# Patient Record
Sex: Female | Born: 1940 | Race: White | Hispanic: No | Marital: Married | Smoking: Never smoker
Health system: Southern US, Community
[De-identification: ages and names within clinical notes are randomized; demographics above are authoritative.]

## PROBLEM LIST (undated history)

## (undated) DIAGNOSIS — Z86711 Personal history of pulmonary embolism: Secondary | ICD-10-CM

## (undated) DIAGNOSIS — C4490 Unspecified malignant neoplasm of skin, unspecified: Secondary | ICD-10-CM

## (undated) DIAGNOSIS — I82409 Acute embolism and thrombosis of unspecified deep veins of unspecified lower extremity: Secondary | ICD-10-CM

## (undated) DIAGNOSIS — E039 Hypothyroidism, unspecified: Secondary | ICD-10-CM

## (undated) DIAGNOSIS — K760 Fatty (change of) liver, not elsewhere classified: Secondary | ICD-10-CM

## (undated) DIAGNOSIS — E119 Type 2 diabetes mellitus without complications: Secondary | ICD-10-CM

## (undated) DIAGNOSIS — Z95818 Presence of other cardiac implants and grafts: Secondary | ICD-10-CM

## (undated) DIAGNOSIS — I5030 Unspecified diastolic (congestive) heart failure: Secondary | ICD-10-CM

## (undated) DIAGNOSIS — E669 Obesity, unspecified: Secondary | ICD-10-CM

## (undated) HISTORY — DX: Fatty (change of) liver, not elsewhere classified: K76.0

## (undated) HISTORY — DX: Unspecified malignant neoplasm of skin, unspecified: C44.90

## (undated) HISTORY — DX: Type 2 diabetes mellitus without complications: E11.9

## (undated) HISTORY — DX: Obesity, unspecified: E66.9

## (undated) HISTORY — PX: SKIN BIOPSY: SHX1

## (undated) HISTORY — PX: FINGER NAIL SURGERY: SHX717

## (undated) HISTORY — DX: Unspecified diastolic (congestive) heart failure: I50.30

## (undated) HISTORY — PX: REPLACEMENT TOTAL KNEE: SUR1224

## (undated) HISTORY — PX: CATARACT EXTRACTION: SUR2

## (undated) HISTORY — DX: Hypothyroidism, unspecified: E03.9

## (undated) HISTORY — DX: Personal history of pulmonary embolism: Z86.711

## (undated) HISTORY — PX: THYROIDECTOMY: SHX17

## (undated) HISTORY — DX: Presence of other cardiac implants and grafts: Z95.818

## (undated) HISTORY — PX: OTHER SURGICAL HISTORY: SHX169

## (undated) HISTORY — PX: HERNIA REPAIR: SHX51

## (undated) HISTORY — DX: Acute embolism and thrombosis of unspecified deep veins of unspecified lower extremity: I82.409

## (undated) HISTORY — PX: ABDOMINAL HYSTERECTOMY: SHX81

---

## 2008-11-03 ENCOUNTER — Encounter: Admission: RE | Admit: 2008-11-03 | Discharge: 2008-11-03 | Payer: Self-pay | Admitting: Internal Medicine

## 2019-02-14 LAB — TSH: TSH: 0.95 (ref 0.41–5.90)

## 2019-05-22 LAB — TSH: TSH: 1.17 (ref 0.41–5.90)

## 2019-07-13 ENCOUNTER — Encounter: Payer: Self-pay | Admitting: "Endocrinology

## 2019-07-13 ENCOUNTER — Ambulatory Visit (INDEPENDENT_AMBULATORY_CARE_PROVIDER_SITE_OTHER): Payer: Medicare Other | Admitting: "Endocrinology

## 2019-07-13 ENCOUNTER — Other Ambulatory Visit: Payer: Self-pay

## 2019-07-13 ENCOUNTER — Encounter (INDEPENDENT_AMBULATORY_CARE_PROVIDER_SITE_OTHER): Payer: Self-pay

## 2019-07-13 VITALS — BP 160/78 | HR 80 | Ht 63.0 in | Wt 281.0 lb

## 2019-07-13 DIAGNOSIS — E890 Postprocedural hypothyroidism: Secondary | ICD-10-CM

## 2019-07-13 DIAGNOSIS — Z85850 Personal history of malignant neoplasm of thyroid: Secondary | ICD-10-CM | POA: Diagnosis not present

## 2019-07-13 DIAGNOSIS — I10 Essential (primary) hypertension: Secondary | ICD-10-CM | POA: Insufficient documentation

## 2019-07-13 NOTE — Progress Notes (Signed)
Endocrinology Consult Note                                            07/13/2019, 6:47 PM   Subjective:    Patient ID: Bethany Riddle, female    DOB: January 13, 1941, PCP [name], MD   Past Medical History:  Diagnosis Date  . Diabetes mellitus, type II ([place])   . Hypothyroidism   . Skin cancer    Past Surgical History:  Procedure Laterality Date  . ABDOMINAL HYSTERECTOMY    . HERNIA REPAIR    . REPLACEMENT TOTAL KNEE    . THYROIDECTOMY     Social History   Socioeconomic History  . Marital status: Married    Spouse name: Not on file  . Number of children: Not on file  . Years of education: Not on file  . Highest education level: Not on file  Occupational History  . Not on file  Social Needs  . Financial resource strain: Not on file  . Food insecurity    Worry: Not on file    Inability: Not on file  . Transportation needs    Medical: Not on file    Non-medical: Not on file  Tobacco Use  . Smoking status: Never Smoker  . Smokeless tobacco: Never Used  Substance and Sexual Activity  . Alcohol use: Not Currently  . Drug use: Never  . Sexual activity: Not on file  Lifestyle  . Physical activity    Days per week: Not on file    Minutes per session: Not on file  . Stress: Not on file  Relationships  . Social Herbalist on phone: Not on file    Gets together: Not on file    Attends religious service: Not on file    Active member of club or organization: Not on file    Attends meetings of clubs or organizations: Not on file    Relationship status: Not on file  Other Topics Concern  . Not on file  Social History Narrative  . Not on file   Family History  Problem Relation Age of Onset  . Diabetes Mother   . Thyroid disease Mother   . Cancer Mother   . Cancer Sister   . Diabetes Sister   . Thyroid disease Sister    Outpatient Encounter Medications as of 07/13/2019  Medication Sig  . Ascorbic Acid (VITAMIN C) 1000 MG tablet Take 1,000  mg by mouth daily.  . B Complex-C (SUPER B COMPLEX PO) Take by mouth daily.  Marland Kitchen doxycycline (VIBRAMYCIN) 100 MG capsule daily.  . TURMERIC PO Take 3 tablets by mouth daily.  Marland Kitchen VITAMIN D PO Take 1,000 mg by mouth daily.  Marland Kitchen levothyroxine (SYNTHROID) 200 MCG tablet daily.   No facility-administered encounter medications on file as of 07/13/2019.    ALLERGIES: No Known Allergies  VACCINATION STATUS:  There is no immunization history on file for this patient.  HPI Bethany Riddle is 78 y.o. female who presents today with a medical history as above. she is being seen in consultation for postsurgical hypothyroidism requested by [name], MD.  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  The details of her treatment with surgery is not available for review.  She has been initiated on levothyroxine subsequent to her surgery.  She has taken various dose of levothyroxine over the years.  She is currently on levothyroxine 200 mcg p.o. daily before breakfast.  She is compliant to her medications, reports that she is taking it daily in the morning on empty stomach.  -She did not have recent surveillance imaging studies of her thyroid/neck.  She denies dysphagia, shortness of breath, nor voice change. -She reports progressive weight gain, has not really tried any dietary plans to lose weight. - she  admits there is a room for improvement in her diet and drink choices.  -She denies palpitations, tremors, nor heat/cold intolerance.  She denies any family history of thyroid malignancy.  Review of Systems  Constitutional: + recent weight gain, no fatigue, no subjective hyperthermia, no subjective hypothermia Eyes: no blurry vision, no xerophthalmia ENT: no sore throat, no nodules palpated in throat, no dysphagia/odynophagia, no hoarseness Cardiovascular: no Chest Pain, no Shortness of Breath, no palpitations, no leg swelling Respiratory: no cough, no shortness of  breath Gastrointestinal: no Nausea/Vomiting/Diarhhea Musculoskeletal: no muscle/joint aches Skin: no rashes Neurological: no tremors, no numbness, no tingling, no dizziness Psychiatric: no depression, no anxiety  Objective:    BP (!) 160/78   Pulse 80   Ht 5\' 3"  (1.6 m)   Wt 281 lb (127.5 kg)   BMI 49.78 kg/m   Wt Readings from Last 3 Encounters:  07/13/19 281 lb (127.5 kg)    Physical Exam  Constitutional: + BMI of 49, not in acute distress, normal state of mind Eyes: PERRLA, EOMI, no exophthalmos ENT: moist mucous membranes, + remote surgical scar on anterior lower neck,  no gross cervical lymphadenopathy Cardiovascular: normal precordial activity, Regular Rate and Rhythm, no Murmur/Rubs/Gallops Respiratory:  adequate breathing efforts, no gross chest deformity, Clear to auscultation bilaterally Gastrointestinal: abdomen soft, Non -tender, No distension, Bowel Sounds present, no gross organomegaly Musculoskeletal: no gross deformities, strength intact in all four extremities Skin: moist, warm, no rashes Neurological: no tremor with outstretched hands, Deep tendon reflexes normal in bilateral lower extremities.     Lab Results  Component Value Date   TSH 1.17 05/22/2019   TSH 0.95 02/14/2019       Assessment & Plan:   1. Postsurgical hypothyroidism 2. History of thyroid cancer 3.  Obesity - Bethany Riddle  is being seen at a kind request of [name], MD. - I have reviewed her available thyroid records and clinically evaluated the patient. - Based on these reviews, she has postsurgical hypothyroidism,  however, records of her initial treatment for thyroid cancer are not available to review.    -She will sign for medical release form for [place] to get his records from [facility]   -I will also proceed to obtain baseline thyroid/neck ultrasound in [facility]. -Her her most recent thyroid function tests, she will benefit from her current dose of  levothyroxine at 200 mcg.  She will have repeat thyroid function tests involving TSH, free T4/free T3, thyroglobulin levels and thyroglobulin advised before adjusting her dose.     - We discussed about the correct intake of her thyroid hormone, on empty stomach at fasting, with water, separated by at least 30 minutes from breakfast and other medications,  and separated by more than 4 hours from calcium, iron, multivitamins, acid reflux medications (PPIs). -Patient is made aware of the fact that thyroid hormone replacement is needed for life, dose to be adjusted by periodic monitoring of thyroid function tests.  Regarding her concern on obesity: -She has  room on lifestyle modification. - she  admits there is a room for improvement in her diet and drink choices. -  Suggestion is made for her to avoid simple carbohydrates  from her diet including Cakes, Sweet Desserts / Pastries, Ice Cream, Soda (diet and regular), Sweet Tea, Candies, Chips, Cookies, Sweet Pastries,  Store Bought Juices, Alcohol in Excess of  1-2 drinks a day, Artificial Sweeteners, Coffee Creamer, and "Sugar-free" Products. This will help patient to have stable blood glucose profile and potentially avoid unintended weight gain.  She has elevated blood pressure of 160/78, denies history of hypertension.  She wishes to minimize medications exposure. - I advised her  to maintain close follow up with [name], MD for primary care needs.   - Time spent with the patient: 45 minutes, of which >50% was spent in obtaining information about her symptoms, reviewing her previous labs/studies,  evaluations, and treatments, counseling her about her postsurgical hypothyroidism, history of thyroid malignancy, obesity, and developing a plan to confirm the diagnosis and long term treatment based on the latest standards of care/guidelines.    Thatiana Riddle participated in the discussions, expressed understanding, and voiced agreement with the  above plans.  All questions were answered to her satisfaction. she is encouraged to contact clinic should she have any questions or concerns prior to her return visit.  Follow up plan: Return in about 10 days (around 07/23/2019), or Request Thyroid records from [facility], for Labs Today- Non-Fasting Ok.   Glade Lloyd, MD [facility] [address] [place], [place] [address] Phone: [phone]  Fax: [phone]     07/13/2019, 6:47 PM  This note was partially dictated with voice recognition software. Similar sounding words can be transcribed inadequately or may not  be corrected upon review.

## 2019-07-14 LAB — THYROGLOBULIN ANTIBODY: Thyroglobulin Ab: <1 IU/mL (ref <=1)

## 2019-07-14 LAB — TSH: TSH: 2.78 mIU/L (ref 0.40–4.50)

## 2019-07-14 LAB — VITAMIN D 25 HYDROXY (VIT D DEFICIENCY, FRACTURES): Vit D, 25-Hydroxy: 24 ng/mL — ABNORMAL LOW (ref 30–100)

## 2019-07-14 LAB — T4, FREE: Free T4: 1.1 ng/dL (ref 0.8–1.8)

## 2019-07-14 LAB — T3, FREE: T3, Free: 2.1 pg/mL — ABNORMAL LOW (ref 2.3–4.2)

## 2019-07-14 LAB — THYROGLOBULIN LEVEL: Thyroglobulin: 0.1 ng/mL — ABNORMAL LOW

## 2019-07-27 ENCOUNTER — Ambulatory Visit: Payer: Medicare Other | Admitting: "Endocrinology

## 2019-08-02 ENCOUNTER — Ambulatory Visit (INDEPENDENT_AMBULATORY_CARE_PROVIDER_SITE_OTHER): Payer: Medicare Other | Admitting: "Endocrinology

## 2019-08-02 ENCOUNTER — Other Ambulatory Visit: Payer: Self-pay

## 2019-08-02 ENCOUNTER — Encounter: Payer: Self-pay | Admitting: "Endocrinology

## 2019-08-02 DIAGNOSIS — E559 Vitamin D deficiency, unspecified: Secondary | ICD-10-CM | POA: Diagnosis not present

## 2019-08-02 DIAGNOSIS — E890 Postprocedural hypothyroidism: Secondary | ICD-10-CM

## 2019-08-02 DIAGNOSIS — Z85850 Personal history of malignant neoplasm of thyroid: Secondary | ICD-10-CM

## 2019-08-02 MED ORDER — VITAMIN D3 125 MCG (5000 UT) PO CAPS: 5000.0000 [IU] | ORAL_CAPSULE | Freq: Every day | ORAL | 0 refills | Status: DC

## 2019-08-02 MED ORDER — LEVOTHYROXINE SODIUM 200 MCG PO TABS: 200.0000 ug | ORAL_TABLET | Freq: Every day | ORAL | 3 refills | Status: DC

## 2019-08-02 MED ORDER — LEVOTHYROXINE SODIUM 25 MCG PO TABS: 25.0000 ug | ORAL_TABLET | Freq: Every day | ORAL | 3 refills | Status: DC

## 2019-08-02 NOTE — Progress Notes (Signed)
08/02/2019, 1:05 PM                                Endocrinology Telehealth Visit Follow up Note -During COVID -19 Pandemic  I connected with Bethany Riddle on 08/02/2019   by telephone and verified that I am speaking with the correct person using two identifiers. Bethany Riddle, [birth year]. she has verbally consented to this visit. All issues noted in this document were discussed and addressed. The format was not optimal for physical exam.   Subjective:    Patient ID: Bethany Riddle, female    DOB: 08-17-1941, PCP [name], MD   Past Medical History:  Diagnosis Date  . Diabetes mellitus, type II ([place])   . Hypothyroidism   . Skin cancer    Past Surgical History:  Procedure Laterality Date  . ABDOMINAL HYSTERECTOMY    . HERNIA REPAIR    . REPLACEMENT TOTAL KNEE    . THYROIDECTOMY     Social History   Socioeconomic History  . Marital status: Married    Spouse name: Not on file  . Number of children: Not on file  . Years of education: Not on file  . Highest education level: Not on file  Occupational History  . Not on file  Social Needs  . Financial resource strain: Not on file  . Food insecurity    Worry: Not on file    Inability: Not on file  . Transportation needs    Medical: Not on file    Non-medical: Not on file  Tobacco Use  . Smoking status: Never Smoker  . Smokeless tobacco: Never Used  Substance and Sexual Activity  . Alcohol use: Not Currently  . Drug use: Never  . Sexual activity: Not on file  Lifestyle  . Physical activity    Days per week: Not on file    Minutes per session: Not on file  . Stress: Not on file  Relationships  . Social Herbalist on phone: Not on file    Gets together: Not on file    Attends religious service: Not on file    Active member of club or organization: Not on file    Attends meetings of clubs or organizations: Not on file    Relationship status: Not  on file  Other Topics Concern  . Not on file  Social History Narrative  . Not on file   Family History  Problem Relation Age of Onset  . Diabetes Mother   . Thyroid disease Mother   . Cancer Mother   . Cancer Sister   . Diabetes Sister   . Thyroid disease Sister    Outpatient Encounter Medications as of 08/02/2019  Medication Sig  . Ascorbic Acid (VITAMIN C) 1000 MG tablet Take 1,000 mg by mouth daily.  . B Complex-C (SUPER B COMPLEX PO) Take by mouth daily.  . Cholecalciferol (VITAMIN D3) 125 MCG (5000 UT) CAPS Take 1 capsule (5,000 Units total) by mouth daily.  Marland Kitchen doxycycline (VIBRAMYCIN) 100 MG capsule daily.  Marland Kitchen levothyroxine (SYNTHROID) 200 MCG tablet Take 1 tablet (200 mcg total) by mouth daily before breakfast.  .  levothyroxine (SYNTHROID) 25 MCG tablet Take 1 tablet (25 mcg total) by mouth daily before breakfast.  . TURMERIC PO Take 3 tablets by mouth daily.  Marland Kitchen VITAMIN D PO Take 1,000 mg by mouth daily.  . [DISCONTINUED] levothyroxine (SYNTHROID) 200 MCG tablet daily.   No facility-administered encounter medications on file as of 08/02/2019.    ALLERGIES: No Known Allergies  VACCINATION STATUS:  There is no immunization history on file for this patient.  HPI Bethany Riddle is 78 y.o. female who is engaged in telehealth via telephone for follow-up after she was seen in consultation for postsurgical hypothyroidism requested by [name], MD.  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  The details of her treatment with surgery is not available for review.  She has been initiated on levothyroxine subsequent to her surgery.  She has taken various dose of levothyroxine over the years.  She is currently on levothyroxine 200 mcg p.o. daily before breakfast.  She is compliant to her medications, reports that she is taking it daily in the morning on empty stomach.    She denies dysphagia, shortness of breath, nor voice change. -Her  previsit thyroid/neck ultrasound on July 27 2019 revealed evidence of prior thyroidectomy and otherwise negative ultrasound of the neck. -She reports progressive weight gain, has not really tried any dietary plans to lose weight. - she  admits there is a room for improvement in her diet and drink choices.  -She denies palpitations, tremors, nor heat/cold intolerance.  She denies any family history of thyroid malignancy.  Review of Systems  Constitutional: + recent weight gain, no fatigue, no subjective hyperthermia, no subjective hypothermia Limited as above.  Objective:    There were no vitals taken for this visit.  Wt Readings from Last 3 Encounters:  07/13/19 281 lb (127.5 kg)    Physical Exam      Lab Results  Component Value Date   TSH 2.78 07/13/2019   TSH 1.17 05/22/2019   TSH 0.95 02/14/2019   FREET4 1.1 07/13/2019       Assessment & Plan:   1. Postsurgical hypothyroidism 2. History of thyroid cancer 3.  Obesity  - I have reviewed her available thyroid records and clinically evaluated the patient. - Based on these reviews, she has postsurgical hypothyroidism,  however, records of her initial treatment for thyroid cancer are not available to review.  We are still waiting for her records from [facility]. -Her recent surveillance thyroid/neck ultrasound is negative for any residual thyroid tissue,mass,  or other sonographic abnormality.  -She will be considered for Thyrogen stimulated whole-body scan in 1 year.  -Based on her recent thyroid function tests, she will benefit from slight increase in her levothyroxine dose.  I discussed and increased her levothyroxine to 225 mcg p.o. daily before breakfast.  - We discussed about the correct intake of her thyroid hormone, on empty stomach at fasting, with water, separated by at least 30 minutes from breakfast and other medications,  and separated by more than 4 hours from calcium, iron, multivitamins, acid reflux  medications (PPIs). -Patient is made aware of the fact that thyroid hormone replacement is needed for life, dose to be adjusted by periodic monitoring of thyroid function tests.  Regarding her concern on obesity: -She has room on lifestyle modification. - she  admits there is a room for improvement in her diet and drink choices. -  Suggestion is made for her to avoid simple carbohydrates  from  her diet including Cakes, Sweet Desserts / Pastries, Ice Cream, Soda (diet and regular), Sweet Tea, Candies, Chips, Cookies, Sweet Pastries,  Store Bought Juices, Alcohol in Excess of  1-2 drinks a day, Artificial Sweeteners, Coffee Creamer, and "Sugar-free" Products. This will help patient to have stable blood glucose profile and potentially avoid unintended weight gain.   -During her last visit in this office, she has had  elevated blood pressure of 160/78, denies history of hypertension.  She wishes to minimize medications exposure. - I advised her  to maintain close follow up with [name], MD for primary care needs.   - Patient Care Time Today:  25 min, of which >50% was spent in  counseling and the rest reviewing her  current and  previous labs/studies, previous treatments, and medications' doses and developing a plan for long-term care based on the latest recommendations for standards of care.   Bindu Riddle participated in the discussions, expressed understanding, and voiced agreement with the above plans.  All questions were answered to her satisfaction. she is encouraged to contact clinic should she have any questions or concerns prior to her return visit.   Follow up plan: Return in about 3 months (around 11/02/2019) for Follow up with Pre-visit Labs.   Glade Lloyd, MD [facility] [address] [place], [place] [address] Phone: [phone]  Fax: [phone]     08/02/2019, 1:05 PM  This note was partially dictated with voice  recognition software. Similar sounding words can be transcribed inadequately or may not  be corrected upon review.

## 2019-10-31 LAB — TSH: TSH: 1.35 mIU/L (ref 0.40–4.50)

## 2019-10-31 LAB — T4, FREE: Free T4: 1.1 ng/dL (ref 0.8–1.8)

## 2019-10-31 LAB — VITAMIN D 25 HYDROXY (VIT D DEFICIENCY, FRACTURES): Vit D, 25-Hydroxy: 26 ng/mL — ABNORMAL LOW (ref 30–100)

## 2019-11-07 ENCOUNTER — Ambulatory Visit (INDEPENDENT_AMBULATORY_CARE_PROVIDER_SITE_OTHER): Payer: Medicare Other | Admitting: "Endocrinology

## 2019-11-07 ENCOUNTER — Other Ambulatory Visit: Payer: Self-pay

## 2019-11-07 ENCOUNTER — Encounter: Payer: Self-pay | Admitting: "Endocrinology

## 2019-11-07 DIAGNOSIS — E890 Postprocedural hypothyroidism: Secondary | ICD-10-CM

## 2019-11-07 DIAGNOSIS — Z85850 Personal history of malignant neoplasm of thyroid: Secondary | ICD-10-CM

## 2019-11-07 DIAGNOSIS — E559 Vitamin D deficiency, unspecified: Secondary | ICD-10-CM | POA: Diagnosis not present

## 2019-11-07 MED ORDER — SYNTHROID 200 MCG PO TABS: 200.0000 ug | ORAL_TABLET | Freq: Every day | ORAL | 6 refills | Status: DC

## 2019-11-07 MED ORDER — SYNTHROID 25 MCG PO TABS: 25.0000 ug | ORAL_TABLET | Freq: Every day | ORAL | 6 refills | Status: DC

## 2019-11-07 MED ORDER — VITAMIN D3 125 MCG (5000 UT) PO CAPS: 5000.0000 [IU] | ORAL_CAPSULE | Freq: Every day | ORAL | 1 refills | Status: AC

## 2019-11-07 NOTE — Progress Notes (Signed)
11/07/2019, 10:49 AM                                Endocrinology Telehealth Visit Follow up Note -During COVID -19 Pandemic  I connected with Bethany Riddle on 11/07/2019   by telephone and verified that I am speaking with the correct person using two identifiers. Bethany Riddle, Aug 12, 1941. she has verbally consented to this visit. All issues noted in this document were discussed and addressed. The format was not optimal for physical exam.   Subjective:    Patient ID: Bethany Riddle, female    DOB: 13-Dec-1941, PCP [name], MD   Past Medical History:  Diagnosis Date  . Diabetes mellitus, type II ([place])   . Hypothyroidism   . Skin cancer    Past Surgical History:  Procedure Laterality Date  . ABDOMINAL HYSTERECTOMY    . HERNIA REPAIR    . REPLACEMENT TOTAL KNEE    . THYROIDECTOMY     Social History   Socioeconomic History  . Marital status: Married    Spouse name: Not on file  . Number of children: Not on file  . Years of education: Not on file  . Highest education level: Not on file  Occupational History  . Not on file  Social Needs  . Financial resource strain: Not on file  . Food insecurity    Worry: Not on file    Inability: Not on file  . Transportation needs    Medical: Not on file    Non-medical: Not on file  Tobacco Use  . Smoking status: Never Smoker  . Smokeless tobacco: Never Used  Substance and Sexual Activity  . Alcohol use: Not Currently  . Drug use: Never  . Sexual activity: Not on file  Lifestyle  . Physical activity    Days per week: Not on file    Minutes per session: Not on file  . Stress: Not on file  Relationships  . Social Herbalist on phone: Not on file    Gets together: Not on file    Attends religious service: Not on file    Active member of club or organization: Not on file    Attends meetings of clubs or organizations: Not on file    Relationship status:  Not on file  Other Topics Concern  . Not on file  Social History Narrative  . Not on file   Family History  Problem Relation Age of Onset  . Diabetes Mother   . Thyroid disease Mother   . Cancer Mother   . Cancer Sister   . Diabetes Sister   . Thyroid disease Sister    Outpatient Encounter Medications as of 11/07/2019  Medication Sig  . Ascorbic Acid (VITAMIN C) 1000 MG tablet Take 1,000 mg by mouth daily.  . B Complex-C (SUPER B COMPLEX PO) Take by mouth daily.  . Cholecalciferol (VITAMIN D3) 125 MCG (5000 UT) CAPS Take 1 capsule (5,000 Units total) by mouth daily.  Marland Kitchen doxycycline (VIBRAMYCIN) 100 MG capsule daily.  Marland Kitchen SYNTHROID 200 MCG tablet Take 1 tablet (200 mcg total) by mouth daily before breakfast.  .  SYNTHROID 25 MCG tablet Take 1 tablet (25 mcg total) by mouth daily before breakfast.  . TURMERIC PO Take 3 tablets by mouth daily.  Marland Kitchen VITAMIN D PO Take 1,000 mg by mouth daily.  . [DISCONTINUED] Cholecalciferol (VITAMIN D3) 125 MCG (5000 UT) CAPS Take 1 capsule (5,000 Units total) by mouth daily.  . [DISCONTINUED] levothyroxine (SYNTHROID) 200 MCG tablet Take 1 tablet (200 mcg total) by mouth daily before breakfast.  . [DISCONTINUED] levothyroxine (SYNTHROID) 25 MCG tablet Take 1 tablet (25 mcg total) by mouth daily before breakfast.   No facility-administered encounter medications on file as of 11/07/2019.    ALLERGIES: No Known Allergies  VACCINATION STATUS:  There is no immunization history on file for this patient.  HPI Bethany Riddle is 78 y.o. female who is engaged in telehealth via telephone for follow-up after she was seen in consultation for postsurgical hypothyroidism requested by [name], MD.  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  The details of her treatment with surgery is not available for review.  She has been initiated on levothyroxine subsequent to her surgery.  She has taken various dose of levothyroxine  over the years.  She is currently on levothyroxine 225 mcg p.o. daily before breakfast.  She is compliant to her medications, reports that she is taking it daily in the morning on empty stomach. -She still complains of fatigue, and hair loss.  She wishes to be switched to brand name Synthroid from levothyroxine.   She denies dysphagia, shortness of breath, nor voice change. -Her previsit thyroid/neck ultrasound on July 27 2019 revealed evidence of prior thyroidectomy and otherwise negative ultrasound of the neck. -She reports progressive weight gain, has not really tried any dietary plans to lose weight. - she  admits there is a room for improvement in her diet and drink choices.  -She denies palpitations, tremors, nor heat/cold intolerance.  She denies any family history of thyroid malignancy.  Review of Systems  limited as above.  Objective:    There were no vitals taken for this visit.  Wt Readings from Last 3 Encounters:  07/13/19 281 lb (127.5 kg)    Physical Exam    Lab Results  Component Value Date   TSH 1.35 10/31/2019   TSH 2.78 07/13/2019   TSH 1.17 05/22/2019   TSH 0.95 02/14/2019   FREET4 1.1 10/31/2019   FREET4 1.1 07/13/2019       Assessment & Plan:   1. Postsurgical hypothyroidism 2. History of thyroid cancer 3.  Vitamin D deficiency  -Her previsit thyroid function tests are consistent with appropriate replacement.  She wishes to be switched to Synthroid.  I discussed and switched to her levothyroxine to Synthroid 225 mcg p.o. daily before breakfast.   - We discussed about the correct intake of her thyroid hormone, on empty stomach at fasting, with water, separated by at least 30 minutes from breakfast and other medications,  and separated by more than 4 hours from calcium, iron, multivitamins, acid reflux medications (PPIs). -Patient is made aware of the fact that thyroid hormone replacement is needed for life, dose to be adjusted by periodic monitoring of  thyroid function tests.  -She has history of thyroid malignancy status post surgery in 2012.  Records of her initial treatment for thyroid cancer are not available to review.  We are still waiting for her records from [facility]. -Her recent surveillance thyroid/neck ultrasound in August 2020 is negative for any residual thyroid tissue,mass,  or other sonographic abnormality.  -She will be considered for Thyrogen stimulated whole-body scan in 1 year. -She is advised to continue vitamin D3 supplement at 5000 units daily until next measurement.  - I advised her  to maintain close follow up with [name], MD for primary care needs.  Time for this visit: 15 minutes. Bethany Riddle  participated in the discussions, expressed understanding, and voiced agreement with the above plans.  All questions were answered to her satisfaction. she is encouraged to contact clinic should she have any questions or concerns prior to her return visit.  Follow up plan: Return in about 6 months (around 05/06/2020) for Follow up with Pre-visit Labs.   Glade Lloyd, MD [facility] [address] [place], [place] [address] Phone: [phone]  Fax: [phone]     11/07/2019, 10:49 AM  This note was partially dictated with voice recognition software. Similar sounding words can be transcribed inadequately or may not  be corrected upon review.

## 2019-12-01 ENCOUNTER — Telehealth: Payer: Self-pay | Admitting: "Endocrinology

## 2019-12-01 NOTE — Telephone Encounter (Signed)
Pt said that she thinks taking 225MCG is too much of her thyroid medication. She is going to go ahead and cut out the [facility] for a couple of days to see how she feels. She is keeping a headache and feels her BP is low. She will see how she feels Monday and let us know.

## 2019-12-01 NOTE — Telephone Encounter (Signed)
Sounds good

## 2020-01-29 ENCOUNTER — Other Ambulatory Visit: Payer: Self-pay | Admitting: "Endocrinology

## 2020-01-29 ENCOUNTER — Telehealth: Payer: Self-pay | Admitting: "Endocrinology

## 2020-01-29 DIAGNOSIS — E890 Postprocedural hypothyroidism: Secondary | ICD-10-CM

## 2020-01-29 NOTE — Telephone Encounter (Signed)
Noted, mailed lab order to pt per her request

## 2020-01-29 NOTE — Telephone Encounter (Signed)
I want her to do her labs soon as possible. TSH/ Free T4, and we will decide on her dose.

## 2020-01-29 NOTE — Telephone Encounter (Signed)
Pt said she thinks she needs her thyroid medication lowered. She said she has been having heart issues and is seeing another dr for this. Please Advise.

## 2020-01-31 NOTE — Telephone Encounter (Signed)
Pt seen her PMD yesterday and she had her labs done for her thyroid. He said that they looked okay. She was taking 225 and she quit taking it a month ago because she could tell it was too much. She said her hearts stop beating to fast. That is why she went to the Cardiologist. She will be having a stress test on 2/18. She did a TSH/Free T4 on 01/01/20. Will have pt fax those results to [place].

## 2020-02-02 LAB — TSH: TSH: 3.7 mIU/L (ref 0.40–4.50)

## 2020-02-02 LAB — T4, FREE: Free T4: 1.2 ng/dL (ref 0.8–1.8)

## 2020-02-02 LAB — VITAMIN D 25 HYDROXY (VIT D DEFICIENCY, FRACTURES): Vit D, 25-Hydroxy: 42 ng/mL (ref 30–100)

## 2020-02-06 ENCOUNTER — Ambulatory Visit (INDEPENDENT_AMBULATORY_CARE_PROVIDER_SITE_OTHER): Payer: Medicare Other | Admitting: "Endocrinology

## 2020-02-06 ENCOUNTER — Encounter: Payer: Self-pay | Admitting: "Endocrinology

## 2020-02-06 DIAGNOSIS — E559 Vitamin D deficiency, unspecified: Secondary | ICD-10-CM

## 2020-02-06 DIAGNOSIS — Z85850 Personal history of malignant neoplasm of thyroid: Secondary | ICD-10-CM

## 2020-02-06 DIAGNOSIS — E890 Postprocedural hypothyroidism: Secondary | ICD-10-CM | POA: Diagnosis not present

## 2020-02-06 MED ORDER — SYNTHROID 200 MCG PO TABS: 200.0000 ug | ORAL_TABLET | Freq: Every day | ORAL | 1 refills | Status: DC

## 2020-02-06 NOTE — Progress Notes (Signed)
02/06/2020, 4:02 PM                                Endocrinology Telehealth Visit Follow up Note -During COVID -19 Pandemic  I connected with Jerrika Riddle on 02/06/2020   by telephone and verified that I am speaking with the correct person using two identifiers. Bethany Riddle, [birth year]. she has verbally consented to this visit. All issues noted in this document were discussed and addressed. The format was not optimal for physical exam.   Subjective:    Patient ID: Bethany Riddle, female    DOB: 01-11-1941, PCP [name], MD   Past Medical History:  Diagnosis Date  . Diabetes mellitus, type II ([place])   . Hypothyroidism   . Skin cancer    Past Surgical History:  Procedure Laterality Date  . ABDOMINAL HYSTERECTOMY    . HERNIA REPAIR    . REPLACEMENT TOTAL KNEE    . THYROIDECTOMY     Social History   Socioeconomic History  . Marital status: Married    Spouse name: Not on file  . Number of children: Not on file  . Years of education: Not on file  . Highest education level: Not on file  Occupational History  . Not on file  Tobacco Use  . Smoking status: Never Smoker  . Smokeless tobacco: Never Used  Substance and Sexual Activity  . Alcohol use: Not Currently  . Drug use: Never  . Sexual activity: Not on file  Other Topics Concern  . Not on file  Social History Narrative  . Not on file   Social Determinants of Health   Financial Resource Strain:   . Difficulty of Paying Living Expenses: Not on file  Food Insecurity:   . Worried About Charity fundraiser in the Last Year: Not on file  . Ran Out of Food in the Last Year: Not on file  Transportation Needs:   . Lack of Transportation (Medical): Not on file  . Lack of Transportation (Non-Medical): Not on file  Physical Activity:   . Days of Exercise per Week: Not on file  . Minutes of Exercise per Session: Not on file  Stress:   . Feeling of Stress : Not  on file  Social Connections:   . Frequency of Communication with Friends and Family: Not on file  . Frequency of Social Gatherings with Friends and Family: Not on file  . Attends Religious Services: Not on file  . Active Member of Clubs or Organizations: Not on file  . Attends Archivist Meetings: Not on file  . Marital Status: Not on file   Family History  Problem Relation Age of Onset  . Diabetes Mother   . Thyroid disease Mother   . Cancer Mother   . Cancer Sister   . Diabetes Sister   . Thyroid disease Sister    Outpatient Encounter Medications as of 02/06/2020  Medication Sig  . Ascorbic Acid (VITAMIN C) 1000 MG tablet Take 1,000 mg by mouth daily.  . B Complex-C (SUPER B COMPLEX PO) Take by mouth daily.  . Cholecalciferol (VITAMIN D3) 125 MCG (5000 UT) CAPS  Take 1 capsule (5,000 Units total) by mouth daily.  Marland Kitchen doxycycline (VIBRAMYCIN) 100 MG capsule daily.  Marland Kitchen SYNTHROID 200 MCG tablet Take 1 tablet (200 mcg total) by mouth daily before breakfast.  . TURMERIC PO Take 3 tablets by mouth daily.  Marland Kitchen VITAMIN D PO Take 1,000 mg by mouth daily.  . [DISCONTINUED] SYNTHROID 200 MCG tablet Take 1 tablet (200 mcg total) by mouth daily before breakfast.  . [DISCONTINUED] SYNTHROID 25 MCG tablet Take 1 tablet (25 mcg total) by mouth daily before breakfast.   No facility-administered encounter medications on file as of 02/06/2020.   ALLERGIES: No Known Allergies  VACCINATION STATUS:  There is no immunization history on file for this patient.  HPI Bethany Riddle is 79 y.o. female who is engaged in telehealth via telephone for follow-up after she was seen in consultation for postsurgical hypothyroidism requested by [name], MD.  See previous visit notes.  Recently due to palpitations she decided to lower her dose of Synthroid to 200 mcg p.o. daily.  Her palpitation continues, and she is working with the cardiologist who  scheduled for stress test.  She noticed weight  gain, feeling fatigued.   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  The details of her treatment with surgery is not available for review.  She was continued on 225 mcg of Synthroid during her last visit.      She denies dysphagia, shortness of breath, nor voice change. -Her prior of  thyroid/neck ultrasound on July 27 2019 revealed evidence of prior thyroidectomy and otherwise negative ultrasound of the neck.  She denies any family history of thyroid malignancy.  Review of Systems  Limited as above.  Objective:    There were no vitals taken for this visit.  Wt Readings from Last 3 Encounters:  07/13/19 281 lb (127.5 kg)    Physical Exam    Lab Results  Component Value Date   TSH 3.70 02/01/2020   TSH 1.35 10/31/2019   TSH 2.78 07/13/2019   TSH 1.17 05/22/2019   TSH 0.95 02/14/2019   FREET4 1.2 02/01/2020   FREET4 1.1 10/31/2019   FREET4 1.1 07/13/2019       Assessment & Plan:   1. Postsurgical hypothyroidism 2. History of thyroid cancer 3.  Vitamin D deficiency  -Her previsit thyroid function tests are such that she may need Synthroid 225 mcg p.o. daily.  However, due to her recent palpitations she is advised to maintain her current dose of Synthroid at 200 mcg p.o. daily before breakfast.  Her dose will be readjusted after her cardiology work-up is complete.  - We discussed about the correct intake of her thyroid hormone, on empty stomach at fasting, with water, separated by at least 30 minutes from breakfast and other medications,  and separated by more than 4 hours from calcium, iron, multivitamins, acid reflux medications (PPIs). -Patient is made aware of the fact that thyroid hormone replacement is needed for life, dose to be adjusted by periodic monitoring of thyroid function tests.  -She has history of thyroid malignancy status post surgery in 2012.  Records of her initial treatment for thyroid cancer are not available  to review.  We are still waiting for her records from [facility]. -Her recent surveillance thyroid/neck ultrasound in August 2020 is negative for any residual thyroid tissue,mass,  or other sonographic abnormality.  -She will be considered for Thyrogen stimulated whole-body scan in 1 year. -She is advised to continue vitamin  D3 at 5000 units daily throughout the winter.    - I advised her  to maintain close follow up with [name], MD for primary care needs.     - Time spent on this patient care encounter:  25 minutes of which 50% was spent in  counseling and the rest reviewing  her current and  previous labs / studies and medications  doses and developing a plan for long term care. Ellyce Riddle  participated in the discussions, expressed understanding, and voiced agreement with the above plans.  All questions were answered to her satisfaction. she is encouraged to contact clinic should she have any questions or concerns prior to her return visit.   Follow up plan: Return in about 3 months (around 05/05/2020) for Follow up with Pre-visit Labs.   Glade Lloyd, MD [facility] [address] [place], [place] [address] Phone: [phone]  Fax: [phone]     02/06/2020, 4:02 PM  This note was partially dictated with voice recognition software. Similar sounding words can be transcribed inadequately or may not  be corrected upon review.

## 2020-04-29 LAB — TSH: TSH: 2.88 mIU/L (ref 0.40–4.50)

## 2020-04-29 LAB — T4, FREE: Free T4: 1.5 ng/dL (ref 0.8–1.8)

## 2020-05-07 ENCOUNTER — Ambulatory Visit (INDEPENDENT_AMBULATORY_CARE_PROVIDER_SITE_OTHER): Payer: Medicare Other | Admitting: "Endocrinology

## 2020-05-07 ENCOUNTER — Encounter: Payer: Self-pay | Admitting: "Endocrinology

## 2020-05-07 ENCOUNTER — Other Ambulatory Visit: Payer: Self-pay

## 2020-05-07 VITALS — BP 158/72 | HR 74 | Ht 63.0 in | Wt 277.2 lb

## 2020-05-07 DIAGNOSIS — Z85850 Personal history of malignant neoplasm of thyroid: Secondary | ICD-10-CM

## 2020-05-07 DIAGNOSIS — E890 Postprocedural hypothyroidism: Secondary | ICD-10-CM

## 2020-05-07 DIAGNOSIS — E559 Vitamin D deficiency, unspecified: Secondary | ICD-10-CM

## 2020-05-07 MED ORDER — SYNTHROID 200 MCG PO TABS: 200.0000 ug | ORAL_TABLET | Freq: Every day | ORAL | 3 refills | Status: DC

## 2020-05-07 NOTE — Progress Notes (Signed)
05/07/2020, 12:54 PM                     Endocrinology follow-up note   Subjective:    Patient ID: Bethany Riddle, female    DOB: [birth year], PCP [name], MD   Past Medical History:  Diagnosis Date  . Diabetes mellitus, type II ([place])   . Hypothyroidism   . Skin cancer    Past Surgical History:  Procedure Laterality Date  . ABDOMINAL HYSTERECTOMY    . HERNIA REPAIR    . REPLACEMENT TOTAL KNEE    . THYROIDECTOMY     Social History   Socioeconomic History  . Marital status: Married    Spouse name: Not on file  . Number of children: Not on file  . Years of education: Not on file  . Highest education level: Not on file  Occupational History  . Not on file  Tobacco Use  . Smoking status: Never Smoker  . Smokeless tobacco: Never Used  Substance and Sexual Activity  . Alcohol use: Not Currently  . Drug use: Never  . Sexual activity: Not on file  Other Topics Concern  . Not on file  Social History Narrative  . Not on file   Social Determinants of Health   Financial Resource Strain:   . Difficulty of Paying Living Expenses:   Food Insecurity:   . Worried About Charity fundraiser in the Last Year:   . Arboriculturist in the Last Year:   Transportation Needs:   . Film/video editor (Medical):   Marland Kitchen Lack of Transportation (Non-Medical):   Physical Activity:   . Days of Exercise per Week:   . Minutes of Exercise per Session:   Stress:   . Feeling of Stress :   Social Connections:   . Frequency of Communication with Friends and Family:   . Frequency of Social Gatherings with Friends and Family:   . Attends Religious Services:   . Active Member of Clubs or Organizations:   . Attends Archivist Meetings:   Marland Kitchen Marital Status:    Family History  Problem Relation Age of Onset  . Diabetes Mother   . Thyroid disease Mother   . Cancer Mother   . Cancer Sister   . Diabetes Sister   . Thyroid  disease Sister    Outpatient Encounter Medications as of 05/07/2020  Medication Sig  . Ascorbic Acid (VITAMIN C) 1000 MG tablet Take 1,000 mg by mouth daily.  . B Complex-C (SUPER B COMPLEX PO) Take by mouth daily.  . Cholecalciferol (VITAMIN D3) 125 MCG (5000 UT) CAPS Take 1 capsule (5,000 Units total) by mouth daily.  Marland Kitchen diltiazem (CARDIZEM CD) 120 MG 24 hr capsule Take 1 capsule by mouth daily.  Marland Kitchen doxycycline (VIBRAMYCIN) 100 MG capsule daily.  Marland Kitchen SYNTHROID 200 MCG tablet Take 1 tablet (200 mcg total) by mouth daily before breakfast.  . TURMERIC PO Take 3 tablets by mouth daily.  Marland Kitchen VITAMIN D PO Take 1,000 mg by mouth daily.  . [DISCONTINUED] SYNTHROID 200 MCG tablet Take 1 tablet (200 mcg total) by mouth daily before breakfast.   No facility-administered encounter medications on file as of  05/07/2020.   ALLERGIES: No Known Allergies  VACCINATION STATUS:  There is no immunization history on file for this patient.  HPI Bethany Riddle is 79 y.o. female who is being seen in follow-up after she was seen in consultation for postsurgical hypothyroidism.    PMD: [name], MD.  See previous visit notes.  Recently due to palpitations her Synthroid was lowered to 200 mcg p.o. daily.  She denies palpitations, tremors, nor heat/cold intolerance.  She was initiated on diltiazem by her cardiologist for tachyarrhythmia.   -She has lost 4 pounds since last visit. -She did not have any new complaints today.   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  The details of her treatment with surgery is not available for review.     She denies dysphagia, shortness of breath, nor voice change. -Her prior of  thyroid/neck ultrasound on July 27 2019 revealed evidence of prior thyroidectomy and otherwise negative ultrasound of the neck.  She denies any family history of thyroid malignancy.  Review of Systems  Limited as above.  Objective:    BP (!) 158/72    Pulse 74   Ht 5\' 3"  (1.6 m)   Wt 277 lb 3.2 oz (125.7 kg)   BMI 49.10 kg/m   Wt Readings from Last 3 Encounters:  05/07/20 277 lb 3.2 oz (125.7 kg)  07/13/19 281 lb (127.5 kg)    Physical Exam   Physical Exam- Limited  Constitutional:  Body mass index is 49.1 kg/m. , not in acute distress, normal state of mind Eyes:  EOMI, no exophthalmos Neck: Supple Thyroid: + Old scar from prior thyroidectomy, no mass lesions Respiratory: Adequate breathing efforts Musculoskeletal: no gross deformities, strength intact in all four extremities, no gross restriction of joint movements Skin:  no rashes, no hyperemia Neurological: no tremor with outstretched hands,    Lab Results  Component Value Date   TSH 2.88 04/29/2020   TSH 3.70 02/01/2020   TSH 1.35 10/31/2019   TSH 2.78 07/13/2019   TSH 1.17 05/22/2019   TSH 0.95 02/14/2019   FREET4 1.5 04/29/2020   FREET4 1.2 02/01/2020   FREET4 1.1 10/31/2019   FREET4 1.1 07/13/2019       Assessment & Plan:   1. Postsurgical hypothyroidism 2. History of thyroid cancer 3.  Vitamin D deficiency  -Her previsit thyroid function tests are consistent with appropriate replacement.  She is advised to continue Synthroid 200 mcg p.o. daily before breakfast.    - We discussed about the correct intake of her thyroid hormone, on empty stomach at fasting, with water, separated by at least 30 minutes from breakfast and other medications,  and separated by more than 4 hours from calcium, iron, multivitamins, acid reflux medications (PPIs). -Patient is made aware of the fact that thyroid hormone replacement is needed for life, dose to be adjusted by periodic monitoring of thyroid function tests.   -She has history of thyroid malignancy status post surgery in 2012.  Records of her initial treatment for thyroid cancer are not available to review.   -Her recent surveillance thyroid/neck ultrasound in August 2020 is negative for any residual thyroid  tissue,mass,  or other sonographic abnormality.  -She will be considered for repeat thyroid/neck ultrasound before her next visit in 1 year.  -She is advised to continue vitamin D3 5000 units daily.     - I advised her  to maintain close follow up with [name], MD for primary care needs.     -  Time spent on this patient care encounter:  25 minutes of which 50% was spent in  counseling and the rest reviewing  her current and  previous labs / studies and medications  doses and developing a plan for long term care. Bethany Riddle  participated in the discussions, expressed understanding, and voiced agreement with the above plans.  All questions were answered to her satisfaction. she is encouraged to contact clinic should she have any questions or concerns prior to her return visit.   Follow up plan: Return in about 1 year (around 05/07/2021) for F/U with Pre-visit Labs, Thyroid / Neck Ultrasound.   Glade Lloyd, MD [facility] [address] [place], [place] [address] Phone: [phone]  Fax: [phone]     05/07/2020, 12:54 PM  This note was partially dictated with voice recognition software. Similar sounding words can be transcribed inadequately or may not  be corrected upon review.

## 2020-08-14 LAB — TSH: TSH: 7.19 — AB (ref 0.41–5.90)

## 2020-08-20 ENCOUNTER — Other Ambulatory Visit: Payer: Self-pay | Admitting: "Endocrinology

## 2020-08-21 ENCOUNTER — Telehealth (INDEPENDENT_AMBULATORY_CARE_PROVIDER_SITE_OTHER): Payer: Medicare Other | Admitting: "Endocrinology

## 2020-08-21 ENCOUNTER — Encounter: Payer: Self-pay | Admitting: "Endocrinology

## 2020-08-21 ENCOUNTER — Other Ambulatory Visit: Payer: Self-pay

## 2020-08-21 VITALS — BP 125/60 | Ht 64.0 in | Wt 269.0 lb

## 2020-08-21 DIAGNOSIS — E890 Postprocedural hypothyroidism: Secondary | ICD-10-CM | POA: Diagnosis not present

## 2020-08-21 NOTE — Progress Notes (Signed)
08/21/2020, 6:48 PM                                    Endocrinology Telehealth Visit Follow up Note -During COVID -19 Pandemic  This visit type was conducted  via telephone due to national recommendations for restrictions regarding the COVID-19 Pandemic  in an effort to limit this patient's exposure and mitigate transmission of the corona virus.   I connected with Bethany Riddle on 08/21/2020   by telephone and verified that I am speaking with the correct person using two identifiers. Bethany Riddle, September 18, 1941. she has verbally consented to this visit.  I was in my office and patient was in her residence. No other persons were with me during the encounter. All issues noted in this document were discussed and addressed. The format was not optimal for physical exam.    Subjective:    Patient ID: Bethany Riddle, female    DOB: 12/21/1941, PCP [name], MD   Past Medical History:  Diagnosis Date  . Diabetes mellitus, type II ([place])   . Hypothyroidism   . Skin cancer    Past Surgical History:  Procedure Laterality Date  . ABDOMINAL HYSTERECTOMY    . HERNIA REPAIR    . REPLACEMENT TOTAL KNEE    . THYROIDECTOMY     Social History   Socioeconomic History  . Marital status: Married    Spouse name: Not on file  . Number of children: Not on file  . Years of education: Not on file  . Highest education level: Not on file  Occupational History  . Not on file  Tobacco Use  . Smoking status: Never Smoker  . Smokeless tobacco: Never Used  Vaping Use  . Vaping Use: Never used  Substance and Sexual Activity  . Alcohol use: Not Currently  . Drug use: Never  . Sexual activity: Not on file  Other Topics Concern  . Not on file  Social History Narrative  . Not on file   Social Determinants of Health   Financial Resource Strain:   . Difficulty of Paying Living Expenses: Not on file  Food Insecurity:   . Worried About Paediatric nurse in the Last Year: Not on file  . Ran Out of Food in the Last Year: Not on file  Transportation Needs:   . Lack of Transportation (Medical): Not on file  . Lack of Transportation (Non-Medical): Not on file  Physical Activity:   . Days of Exercise per Week: Not on file  . Minutes of Exercise per Session: Not on file  Stress:   . Feeling of Stress : Not on file  Social Connections:   . Frequency of Communication with Friends and Family: Not on file  . Frequency of Social Gatherings with Friends and Family: Not on file  . Attends Religious Services: Not on file  . Active Member of Clubs or Organizations: Not on file  . Attends Archivist Meetings: Not on file  . Marital Status: Not on file   Family History  Problem Relation Age of Onset  . Diabetes Mother   . Thyroid disease  Mother   . Cancer Mother   . Cancer Sister   . Diabetes Sister   . Thyroid disease Sister    Outpatient Encounter Medications as of 08/21/2020  Medication Sig  . Ascorbic Acid (VITAMIN C) 1000 MG tablet Take 1,000 mg by mouth daily.  . B Complex-C (SUPER B COMPLEX PO) Take by mouth daily.  . Cholecalciferol (VITAMIN D3) 125 MCG (5000 UT) CAPS Take 1 capsule (5,000 Units total) by mouth daily.  Marland Kitchen diltiazem (CARDIZEM CD) 120 MG 24 hr capsule Take 1 capsule by mouth daily.  Marland Kitchen doxycycline (VIBRAMYCIN) 100 MG capsule daily.  Marland Kitchen SYNTHROID 200 MCG tablet Take 1 tablet (200 mcg total) by mouth daily before breakfast.  . TURMERIC PO Take 3 tablets by mouth daily.  . [DISCONTINUED] VITAMIN D PO Take 1,000 mg by mouth daily.   No facility-administered encounter medications on file as of 08/21/2020.   ALLERGIES: No Known Allergies  VACCINATION STATUS:  There is no immunization history on file for this patient.  HPI Bethany Riddle is 79 y.o. female who is being seen in follow-up after she was seen in consultation for postsurgical hypothyroidism.  She is being engaged in telehealth due to her  interval thyroid function test showing higher TSH of 7.19.  This lab result comes from her PMD office.  PMD: [name], MD.  See previous visit notes.  Recently due to palpitations her Synthroid was lowered to 200 mcg p.o. daily.  She actually felt better subsequently, currently she denies palpitations, tremors, nor heat/cold intolerance.   She continues to lose weight, lost 9 pounds since last visit 1215 pounds. -She did not have any new complaints today.   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  The details of her treatment with surgery is not available for review.     She denies dysphagia, shortness of breath, nor voice change. -Her prior of  thyroid/neck ultrasound on July 27 2019 revealed evidence of prior thyroidectomy and otherwise negative ultrasound of the neck.  She denies any family history of thyroid malignancy.  Review of Systems  Limited as above.  Objective:    BP 125/60   Ht 5\' 4"  (1.626 m)   Wt 269 lb (122 kg)   BMI 46.17 kg/m   Wt Readings from Last 3 Encounters:  08/21/20 269 lb (122 kg)  05/07/20 277 lb 3.2 oz (125.7 kg)  07/13/19 281 lb (127.5 kg)    Physical Exam   Lab Results  Component Value Date   TSH 7.19 (A) 08/14/2020   TSH 2.88 04/29/2020   TSH 3.70 02/01/2020   TSH 1.35 10/31/2019   TSH 2.78 07/13/2019   TSH 1.17 05/22/2019   TSH 0.95 02/14/2019   FREET4 1.5 04/29/2020   FREET4 1.2 02/01/2020   FREET4 1.1 10/31/2019   FREET4 1.1 07/13/2019       Assessment & Plan:   1. Postsurgical hypothyroidism 2. History of thyroid cancer 3.  Vitamin D deficiency  -Her previsit thyroid function tests are consistent with inadequate replacement.  However her clinical presentation does not support this conclusion.  She hesitates to increase her thyroid hormone due to the fact that it caused tachyarrhythmia in the past.  She prefers to stay on the same dose of Synthroid at 200 mcg p.o. daily before  breakfast and repeat her full profile thyroid function test in 3 weeks.    - We discussed about the correct intake of her thyroid hormone, on empty stomach  at fasting, with water, separated by at least 30 minutes from breakfast and other medications,  and separated by more than 4 hours from calcium, iron, multivitamins, acid reflux medications (PPIs). -Patient is made aware of the fact that thyroid hormone replacement is needed for life, dose to be adjusted by periodic monitoring of thyroid function tests.  -She has history of thyroid malignancy status post surgery in 2012.  Records of her initial treatment for thyroid cancer are not available to review.   -Her recent surveillance thyroid/neck ultrasound in August 2020 is negative for any residual thyroid tissue,mass,  or other sonographic abnormality.  -She will be considered for repeat thyroid/neck ultrasound before her next visit in 1 year.  -She is advised to continue vitamin D3 5000 units daily.     - I advised her  to maintain close follow up with [name], MD for primary care needs.      - Time spent on this patient care encounter:  20 minutes of which 50% was spent in  counseling and the rest reviewing  her current and  previous labs / studies and medications  doses and developing a plan for long term care. Bethany Riddle  participated in the discussions, expressed understanding, and voiced agreement with the above plans.  All questions were answered to her satisfaction. she is encouraged to contact clinic should she have any questions or concerns prior to her return visit.    Follow up plan: Return in about 4 weeks (around 09/18/2020), or Include [facility] Address for her. Can be a Phone visit., for F/U with Pre-visit Labs.   Glade Lloyd, MD [facility] [address] [place], [place] [address] Phone: [phone]  Fax: [phone]     08/21/2020, 6:48 PM  This note was  partially dictated with voice recognition software. Similar sounding words can be transcribed inadequately or may not  be corrected upon review.

## 2020-09-02 ENCOUNTER — Telehealth: Payer: Self-pay | Admitting: "Endocrinology

## 2020-09-02 NOTE — Telephone Encounter (Signed)
Her thyroid is removed and there was no residual or recurrent tissue in 08/20 . She does not need thyroid ultrasound any sooner , but need to see her PMD for exam to evaluate carotid artery.

## 2020-09-02 NOTE — Telephone Encounter (Signed)
Talked with pt, she stated two months ago she started to notice a feeling of soreness on the L side of her neck and it is now radiating up into her L ear. States it is painful when she applies pressure. Her last ultrasound of soft tissue throat was 07/2019. She has an ultrasound scheduled of April of 2022 but asked if you felt she should have one sooner. Pt also stated she would contact her ENT. Please advise.

## 2020-09-02 NOTE — Telephone Encounter (Signed)
Pt states her ultrasound is not due until next year, but she is constantly feeling something in her throat and would like her ultrasound done as soon as she can. Patient requesting call back from nurse.

## 2020-09-02 NOTE — Telephone Encounter (Signed)
Discussed with pt, understanding voiced. She stated she has an appointment with her ENT this coming Thursday.

## 2020-09-11 LAB — TSH: TSH: 2.13 u[IU]/mL (ref 0.450–4.500)

## 2020-09-11 LAB — T4, FREE: Free T4: 1.52 ng/dL (ref 0.82–1.77)

## 2020-09-19 ENCOUNTER — Telehealth (INDEPENDENT_AMBULATORY_CARE_PROVIDER_SITE_OTHER): Payer: Medicare Other | Admitting: "Endocrinology

## 2020-09-19 ENCOUNTER — Encounter: Payer: Self-pay | Admitting: "Endocrinology

## 2020-09-19 VITALS — Ht 63.0 in

## 2020-09-19 DIAGNOSIS — E890 Postprocedural hypothyroidism: Secondary | ICD-10-CM

## 2020-09-19 MED ORDER — SYNTHROID 200 MCG PO TABS: 200.0000 ug | ORAL_TABLET | Freq: Every day | ORAL | 2 refills | Status: DC

## 2020-09-19 NOTE — Progress Notes (Signed)
09/19/2020, 9:30 PM                                                            Endocrinology Telehealth Visit Follow up Note -During COVID -19 Pandemic  This visit type was conducted  via telephone due to national recommendations for restrictions regarding the COVID-19 Pandemic  in an effort to limit this patient's exposure and mitigate transmission of the corona virus.   I connected with Bethany Riddle on 09/19/2020   by telephone and verified that I am speaking with the correct person using two identifiers. Bethany Riddle, 1941-04-05. she has verbally consented to this visit.  I was in my office and patient was in her residence. No other persons were with me during the encounter. All issues noted in this document were discussed and addressed. The format was not optimal for physical exam.    Subjective:    Patient ID: Bethany Riddle, female    DOB: 06-Jul-1941, PCP [name], MD   Past Medical History:  Diagnosis Date  . Diabetes mellitus, type II ([place])   . Hypothyroidism   . Skin cancer    Past Surgical History:  Procedure Laterality Date  . ABDOMINAL HYSTERECTOMY    . HERNIA REPAIR    . REPLACEMENT TOTAL KNEE    . THYROIDECTOMY     Social History   Socioeconomic History  . Marital status: Married    Spouse name: Not on file  . Number of children: Not on file  . Years of education: Not on file  . Highest education level: Not on file  Occupational History  . Not on file  Tobacco Use  . Smoking status: Never Smoker  . Smokeless tobacco: Never Used  Vaping Use  . Vaping Use: Never used  Substance and Sexual Activity  . Alcohol use: Not Currently  . Drug use: Never  . Sexual activity: Not on file  Other Topics Concern  . Not on file  Social History Narrative  . Not on file   Social Determinants of Health   Financial Resource Strain:   . Difficulty of Paying Living Expenses: Not on file  Food Insecurity:    . Worried About Charity fundraiser in the Last Year: Not on file  . Ran Out of Food in the Last Year: Not on file  Transportation Needs:   . Lack of Transportation (Medical): Not on file  . Lack of Transportation (Non-Medical): Not on file  Physical Activity:   . Days of Exercise per Week: Not on file  . Minutes of Exercise per Session: Not on file  Stress:   . Feeling of Stress : Not on file  Social Connections:   . Frequency of Communication with Friends and Family: Not on file  . Frequency of Social Gatherings with Friends and Family: Not on file  . Attends Religious Services: Not on file  . Active Member of Clubs or Organizations: Not on file  . Attends Archivist Meetings: Not on file  .  Marital Status: Not on file   Family History  Problem Relation Age of Onset  . Diabetes Mother   . Thyroid disease Mother   . Cancer Mother   . Cancer Sister   . Diabetes Sister   . Thyroid disease Sister    Outpatient Encounter Medications as of 09/19/2020  Medication Sig  . Ascorbic Acid (VITAMIN C) 1000 MG tablet Take 1,000 mg by mouth daily.  . B Complex-C (SUPER B COMPLEX PO) Take by mouth daily.  . Cholecalciferol (VITAMIN D3) 125 MCG (5000 UT) CAPS Take 1 capsule (5,000 Units total) by mouth daily.  Marland Kitchen diltiazem (CARDIZEM CD) 120 MG 24 hr capsule Take 1 capsule by mouth daily.  Marland Kitchen doxycycline (VIBRAMYCIN) 100 MG capsule daily.  Marland Kitchen SYNTHROID 200 MCG tablet Take 1 tablet (200 mcg total) by mouth daily before breakfast.  . TURMERIC PO Take 3 tablets by mouth daily.  . [DISCONTINUED] SYNTHROID 200 MCG tablet Take 1 tablet (200 mcg total) by mouth daily before breakfast.   No facility-administered encounter medications on file as of 09/19/2020.   ALLERGIES: No Known Allergies  VACCINATION STATUS:  There is no immunization history on file for this patient.  HPI Bethany Riddle is 79 y.o. female who is being seen in follow-up after she was seen in consultation for  postsurgical hypothyroidism.  She is being engaged in telehealth for follow-up.    PMD: [name], MD.  See previous visit notes.  Recently due to palpitations her Synthroid was lowered to 200 mcg p.o. daily.  She actually felt better subsequently, currently she denies palpitations, tremors, nor heat/cold intolerance.  Her previsit thyroid function tests are distant with appropriate replacement.     -She did not have any new complaints today.   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  The details of her treatment with surgery is not available for review.     She denies dysphagia, shortness of breath, nor voice change. -Her prior of  thyroid/neck ultrasound on July 27 2019 revealed evidence of prior thyroidectomy and otherwise negative ultrasound of the neck.  She denies any family history of thyroid malignancy.  Review of Systems  Limited as above.  Objective:    Ht 5\' 3"  (1.6 m)   BMI 47.65 kg/m   Wt Readings from Last 3 Encounters:  08/21/20 269 lb (122 kg)  05/07/20 277 lb 3.2 oz (125.7 kg)  07/13/19 281 lb (127.5 kg)    Physical Exam   Lab Results  Component Value Date   TSH 2.130 09/10/2020   TSH 7.19 (A) 08/14/2020   TSH 2.88 04/29/2020   TSH 3.70 02/01/2020   TSH 1.35 10/31/2019   TSH 2.78 07/13/2019   TSH 1.17 05/22/2019   TSH 0.95 02/14/2019   FREET4 1.52 09/10/2020   FREET4 1.5 04/29/2020   FREET4 1.2 02/01/2020   FREET4 1.1 10/31/2019   FREET4 1.1 07/13/2019       Assessment & Plan:   1. Postsurgical hypothyroidism 2. History of thyroid cancer 3.  Vitamin D deficiency  -Her previsit thyroid function tests are consistent with appropriate replacement.  She is advised to continue Synthroid 200 mcg p.o. daily before breakfast.    - We discussed about the correct intake of her thyroid hormone, on empty stomach at fasting, with water, separated by at least 30 minutes from breakfast and other medications,   and separated by more than 4 hours from calcium, iron, multivitamins, acid reflux medications (PPIs). -Patient is  made aware of the fact that thyroid hormone replacement is needed for life, dose to be adjusted by periodic monitoring of thyroid function tests.   -She has history of thyroid malignancy status post surgery in 2012.  Records of her initial treatment for thyroid cancer are not available to review.   -Her recent surveillance thyroid/neck ultrasound in August 2020 is negative for any residual thyroid tissue,mass,  or other sonographic abnormality.  -She will be considered for repeat thyroid/neck ultrasound before her next visit in 1 year.  -She is advised to continue vitamin D3 5000 units daily.     - I advised her  to maintain close follow up with [name], MD for primary care needs.      - Time spent on this patient care encounter:  20 minutes of which 50% was spent in  counseling and the rest reviewing  her current and  previous labs / studies and medications  doses and developing a plan for long term care. Venda Riddle  participated in the discussions, expressed understanding, and voiced agreement with the above plans.  All questions were answered to her satisfaction. she is encouraged to contact clinic should she have any questions or concerns prior to her return visit.    Follow up plan: Return in about 6 months (around 03/19/2021) for F/U with Pre-visit Labs.   Glade Lloyd, MD [facility] [address] [place], [place] [address] Phone: [phone]  Fax: [phone]     09/19/2020, 9:30 PM  This note was partially dictated with voice recognition software. Similar sounding words can be transcribed inadequately or may not  be corrected upon review.

## 2020-11-06 ENCOUNTER — Telehealth: Payer: Self-pay

## 2020-11-06 NOTE — Telephone Encounter (Signed)
Patient has been in the hospital and would like you to call her.

## 2020-11-06 NOTE — Telephone Encounter (Signed)
FYI:  Pt called to let you know she has been in the hospital due to blood clots in bilateral lungs and her L leg. States she was discharged home on 10-31. She is taking xarelto 20mg  daily and diltiazem 120mg  daily.

## 2020-11-06 NOTE — Telephone Encounter (Signed)
Noted  

## 2021-01-13 ENCOUNTER — Other Ambulatory Visit: Payer: Self-pay

## 2021-01-13 DIAGNOSIS — Z85850 Personal history of malignant neoplasm of thyroid: Secondary | ICD-10-CM

## 2021-01-20 ENCOUNTER — Ambulatory Visit (HOSPITAL_COMMUNITY)
Admission: RE | Admit: 2021-01-20 | Discharge: 2021-01-20 | Disposition: A | Discharge status: Home / Self Care | Payer: Medicare Other | Source: Ambulatory Visit | Attending: "Endocrinology | Admitting: "Endocrinology

## 2021-01-20 ENCOUNTER — Other Ambulatory Visit: Payer: Self-pay

## 2021-01-20 DIAGNOSIS — Z85850 Personal history of malignant neoplasm of thyroid: Secondary | ICD-10-CM | POA: Insufficient documentation

## 2021-01-21 LAB — T4, FREE: Free T4: 1.88 ng/dL — ABNORMAL HIGH (ref 0.82–1.77)

## 2021-01-21 LAB — TSH: TSH: 1.16 u[IU]/mL (ref 0.450–4.500)

## 2021-01-27 ENCOUNTER — Telehealth: Payer: Self-pay

## 2021-01-27 DIAGNOSIS — E890 Postprocedural hypothyroidism: Secondary | ICD-10-CM

## 2021-01-27 NOTE — Telephone Encounter (Signed)
Discussed with pt, understanding voiced. Pt had her lab work done 01/20/21 but is scheduled to see you on 3/30. Pt asked if she needs to have more lab work closer to her appointment.

## 2021-01-27 NOTE — Telephone Encounter (Signed)
Discussed with pt, understanding voiced. 

## 2021-01-27 NOTE — Telephone Encounter (Signed)
Yes, we will need TSH and Free t4.

## 2021-01-27 NOTE — Telephone Encounter (Signed)
Pt left a VM that she has not been notified of her Ultrasound results - she said we should have called her and gave her an appt for results. She is already on the sch for March. Please advise

## 2021-01-27 NOTE — Telephone Encounter (Signed)
It is a negative study. No sonographic evidence of recurrent thyroid malignancy. She does not need any additional action on her thyroid, we can discuss it when she returns with labs as schedule.

## 2021-03-13 LAB — TSH: TSH: 0.773 u[IU]/mL (ref 0.450–4.500)

## 2021-03-13 LAB — T4, FREE: Free T4: 1.91 ng/dL — ABNORMAL HIGH (ref 0.82–1.77)

## 2021-03-19 ENCOUNTER — Ambulatory Visit: Payer: Medicare Other | Admitting: "Endocrinology

## 2021-03-20 ENCOUNTER — Encounter: Payer: Self-pay | Admitting: "Endocrinology

## 2021-03-20 ENCOUNTER — Ambulatory Visit (INDEPENDENT_AMBULATORY_CARE_PROVIDER_SITE_OTHER): Payer: Medicare Other | Admitting: "Endocrinology

## 2021-03-20 ENCOUNTER — Other Ambulatory Visit: Payer: Self-pay

## 2021-03-20 VITALS — BP 136/60 | HR 64 | Ht 63.0 in | Wt 249.0 lb

## 2021-03-20 DIAGNOSIS — Z85850 Personal history of malignant neoplasm of thyroid: Secondary | ICD-10-CM

## 2021-03-20 DIAGNOSIS — E890 Postprocedural hypothyroidism: Secondary | ICD-10-CM | POA: Diagnosis not present

## 2021-03-20 MED ORDER — SYNTHROID 175 MCG PO TABS: 175.0000 ug | ORAL_TABLET | Freq: Every day | ORAL | 1 refills | Status: DC

## 2021-03-20 NOTE — Patient Instructions (Signed)

## 2021-03-20 NOTE — Progress Notes (Signed)
03/20/2021, 2:28 PM      Endocrinology follow-up note  Subjective:    Patient ID: Bethany Riddle, female    DOB: 1941-03-09, PCP Minna Antis, DO   Past Medical History:  Diagnosis Date  . Diabetes mellitus, type II ([place])   . Hypothyroidism   . Skin cancer    Past Surgical History:  Procedure Laterality Date  . ABDOMINAL HYSTERECTOMY    . HERNIA REPAIR    . REPLACEMENT TOTAL KNEE    . THYROIDECTOMY     Social History   Socioeconomic History  . Marital status: Married    Spouse name: Not on file  . Number of children: Not on file  . Years of education: Not on file  . Highest education level: Not on file  Occupational History  . Not on file  Tobacco Use  . Smoking status: Never Smoker  . Smokeless tobacco: Never Used  Vaping Use  . Vaping Use: Never used  Substance and Sexual Activity  . Alcohol use: Not Currently  . Drug use: Never  . Sexual activity: Not on file  Other Topics Concern  . Not on file  Social History Narrative  . Not on file   Social Determinants of Health   Financial Resource Strain: Not on file  Food Insecurity: Not on file  Transportation Needs: Not on file  Physical Activity: Not on file  Stress: Not on file  Social Connections: Not on file   Family History  Problem Relation Age of Onset  . Diabetes Mother   . Thyroid disease Mother   . Cancer Mother   . Cancer Sister   . Diabetes Sister   . Thyroid disease Sister    Outpatient Encounter Medications as of 03/20/2021  Medication Sig  . Cyanocobalamin (VITAMIN B-12 PO) Take 1 tablet by mouth daily in the afternoon.  . furosemide (LASIX) 40 MG tablet Take 1 tablet by mouth daily.  . potassium chloride SA (KLOR-CON) 20 MEQ tablet Take 2 tablets by mouth daily.  . rivaroxaban (XARELTO) 20 MG TABS tablet Take 1 tablet by mouth daily.  . sertraline (ZOLOFT) 25 MG tablet Take 1 tablet by mouth daily.  Marland Kitchen SYNTHROID 175 MCG tablet  Take 1 tablet (175 mcg total) by mouth daily before breakfast.  . Ascorbic Acid (VITAMIN C) 1000 MG tablet Take 1,000 mg by mouth daily.  . B Complex-C (SUPER B COMPLEX PO) Take by mouth daily.  . Cholecalciferol (VITAMIN D3) 125 MCG (5000 UT) CAPS Take 1 capsule (5,000 Units total) by mouth daily.  Marland Kitchen diltiazem (CARDIZEM CD) 120 MG 24 hr capsule Take 1 capsule by mouth daily.  Marland Kitchen doxycycline (VIBRAMYCIN) 100 MG capsule daily.  . famotidine (PEPCID) 20 MG tablet Take 1 tablet by mouth daily as needed.  . TURMERIC PO Take 3 tablets by mouth daily.  . [DISCONTINUED] SYNTHROID 200 MCG tablet Take 1 tablet (200 mcg total) by mouth daily before breakfast.   No facility-administered encounter medications on file as of 03/20/2021.   ALLERGIES: No Known Allergies  VACCINATION STATUS:  There is no immunization history on file for this patient.  HPI Bethany Riddle is 80 y.o. female who is being seen in follow-up for postsurgical hypothyroidism.  She has history of thyroid malignancy status post thyroidectomy.  He also has well-controlled type 2 diabetes on no medication.   PMD: Minna Antis, DO.  See previous visit notes.  She remains on Synthroid 200 mcg p.o. daily before breakfast.  She reports compliance with medication.  In the interval, she lost significant amount of weight due to several illnesses.   She denies palpitations, tremor, heat intolerance.  She denies dysphagia, shortness of breath, no voice change.  Her thyroid/neck ultrasound from January 2022 is negative for any thyroid residual no recurrence.   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  The details of her treatment with surgery is not available for review.     She denies dysphagia, shortness of breath, nor voice change. -Her prior of  thyroid/neck ultrasound on July 27 2019 revealed evidence of prior thyroidectomy and otherwise negative ultrasound of the neck.  She denies any family  history of thyroid malignancy.  Review of Systems  Limited as above.  Objective:    BP 136/60   Pulse 64   Ht 5\' 3"  (1.6 m)   Wt 249 lb (112.9 kg)   BMI 44.11 kg/m   Wt Readings from Last 3 Encounters:  03/20/21 249 lb (112.9 kg)  08/21/20 269 lb (122 kg)  05/07/20 277 lb 3.2 oz (125.7 kg)    Physical Exam   Lab Results  Component Value Date   TSH 0.773 03/12/2021   TSH 1.160 01/20/2021   TSH 2.130 09/10/2020   TSH 7.19 (A) 08/14/2020   TSH 2.88 04/29/2020   TSH 3.70 02/01/2020   TSH 1.35 10/31/2019   TSH 2.78 07/13/2019   TSH 1.17 05/22/2019   TSH 0.95 02/14/2019   FREET4 1.91 (H) 03/12/2021   FREET4 1.88 (H) 01/20/2021   FREET4 1.52 09/10/2020   FREET4 1.5 04/29/2020   FREET4 1.2 02/01/2020   FREET4 1.1 10/31/2019   FREET4 1.1 07/13/2019       Assessment & Plan:   1. Postsurgical hypothyroidism 2. History of thyroid cancer 3.  Vitamin D deficiency  -Her previsit thyroid function tests are consistent with over replacement.  This could be due to several significant weight loss she had experienced since last visit.  I discussed and lowered her Synthroid to 175 mcg p.o. daily before breakfast.     - We discussed about the correct intake of her thyroid hormone, on empty stomach at fasting, with water, separated by at least 30 minutes from breakfast and other medications,  and separated by more than 4 hours from calcium, iron, multivitamins, acid reflux medications (PPIs). -Patient is made aware of the fact that thyroid hormone replacement is needed for life, dose to be adjusted by periodic monitoring of thyroid function tests.   -She has history of thyroid malignancy status post surgery in 2012.  Records of her initial treatment for thyroid cancer are not available to review.   -Her recent surveillance thyroid/neck ultrasound in January 2021 is negative for any residual thyroid tissue,mass,  or other sonographic abnormality.  -She will not need any further  thyroid/neck imaging surveillance.   Regarding her history of type 2 diabetes, with recent A1c of 7.1%.  She did not tolerate Metformin.  Recent significant weight loss will help her maintain control without intervention. - she acknowledges that there is a room for improvement in her food and drink choices. - Suggestion is made for her to avoid simple carbohydrates  from her diet including Cakes, Sweet  Desserts, Ice Cream, Soda (diet and regular), Sweet Tea, Candies, Chips, Cookies, Store Bought Juices, Alcohol in Excess of  1-2 drinks a day, Artificial Sweeteners,  Coffee Creamer, and "Sugar-free" Products, Lemonade. This will help patient to have more stable blood glucose profile and potentially avoid unintended weight gain.  She will be considered for other options if A1c remains above 7% by next visit.  -She is advised to continue vitamin D3 5000 units daily.     - I advised her  to maintain close follow up with Minna Antis, DO for primary care needs.      - Time spent on this patient care encounter:  30 minutes of which 50% was spent in  counseling and the rest reviewing  her current and  previous labs / studies and medications  doses and developing a plan for long term care, and documenting this care. Lakayla Riddle  participated in the discussions, expressed understanding, and voiced agreement with the above plans.  All questions were answered to her satisfaction. she is encouraged to contact clinic should she have any questions or concerns prior to her return visit.    Follow up plan: Return in about 5 months (around 08/20/2021) for F/U with Pre-visit Labs, A1c -NV.   Glade Lloyd, MD [facility] [address] [place], [place] [address] Phone: [phone]  Fax: [phone]     03/20/2021, 2:28 PM  This note was partially dictated with voice recognition software. Similar sounding words can be transcribed inadequately or may  not  be corrected upon review.

## 2021-05-08 ENCOUNTER — Ambulatory Visit: Payer: Medicare Other | Admitting: "Endocrinology

## 2021-08-01 LAB — TSH: TSH: 1.76 u[IU]/mL (ref 0.450–4.500)

## 2021-08-01 LAB — T4, FREE: Free T4: 1.61 ng/dL (ref 0.82–1.77)

## 2021-08-06 ENCOUNTER — Encounter: Payer: Self-pay | Admitting: "Endocrinology

## 2021-08-06 ENCOUNTER — Other Ambulatory Visit: Payer: Self-pay

## 2021-08-06 ENCOUNTER — Ambulatory Visit (INDEPENDENT_AMBULATORY_CARE_PROVIDER_SITE_OTHER): Payer: Medicare Other | Admitting: "Endocrinology

## 2021-08-06 VITALS — BP 120/78 | HR 68 | Ht 63.0 in | Wt 256.0 lb

## 2021-08-06 DIAGNOSIS — E890 Postprocedural hypothyroidism: Secondary | ICD-10-CM | POA: Diagnosis not present

## 2021-08-06 DIAGNOSIS — Z85850 Personal history of malignant neoplasm of thyroid: Secondary | ICD-10-CM | POA: Diagnosis not present

## 2021-08-06 MED ORDER — SYNTHROID 175 MCG PO TABS: 175.0000 ug | ORAL_TABLET | Freq: Every day | ORAL | 1 refills | Status: DC

## 2021-08-06 NOTE — Progress Notes (Signed)
08/06/2021, 1:48 PM      Endocrinology follow-up note  Subjective:    Patient ID: Bethany Riddle, female    DOB: 08-10-1941, PCP Minna Antis, DO   Past Medical History:  Diagnosis Date   Diabetes mellitus, type II ([place])    Hypothyroidism    Skin cancer    Past Surgical History:  Procedure Laterality Date   ABDOMINAL HYSTERECTOMY     HERNIA REPAIR     REPLACEMENT TOTAL KNEE     THYROIDECTOMY     Social History   Socioeconomic History   Marital status: Married    Spouse name: Not on file   Number of children: Not on file   Years of education: Not on file   Highest education level: Not on file  Occupational History   Not on file  Tobacco Use   Smoking status: Never   Smokeless tobacco: Never  Vaping Use   Vaping Use: Never used  Substance and Sexual Activity   Alcohol use: Not Currently   Drug use: Never   Sexual activity: Not on file  Other Topics Concern   Not on file  Social History Narrative   Not on file   Social Determinants of Health   Financial Resource Strain: Not on file  Food Insecurity: Not on file  Transportation Needs: Not on file  Physical Activity: Not on file  Stress: Not on file  Social Connections: Not on file   Family History  Problem Relation Age of Onset   Diabetes Mother    Thyroid disease Mother    Cancer Mother    Cancer Sister    Diabetes Sister    Thyroid disease Sister    Outpatient Encounter Medications as of 08/06/2021  Medication Sig   Ascorbic Acid (VITAMIN C) 1000 MG tablet Take 1,000 mg by mouth daily.   B Complex-C (SUPER B COMPLEX PO) Take by mouth daily.   Cholecalciferol (VITAMIN D3) 125 MCG (5000 UT) CAPS Take 1 capsule (5,000 Units total) by mouth daily.   Cyanocobalamin (VITAMIN B-12 PO) Take 1 tablet by mouth daily in the afternoon.   diltiazem (CARDIZEM CD) 120 MG 24 hr capsule Take 1 capsule by mouth daily.   doxycycline (VIBRAMYCIN) 100 MG capsule  daily.   famotidine (PEPCID) 20 MG tablet Take 1 tablet by mouth daily as needed. (Patient not taking: Reported on 08/06/2021)   furosemide (LASIX) 40 MG tablet Take 1 tablet by mouth daily.   potassium chloride SA (KLOR-CON) 20 MEQ tablet Take 2 tablets by mouth daily.   rivaroxaban (XARELTO) 20 MG TABS tablet Take 1 tablet by mouth daily.   sertraline (ZOLOFT) 25 MG tablet Take 1 tablet by mouth daily. (Patient not taking: Reported on 08/06/2021)   SYNTHROID 175 MCG tablet Take 1 tablet (175 mcg total) by mouth daily before breakfast.   TURMERIC PO Take 3 tablets by mouth daily.   [DISCONTINUED] SYNTHROID 175 MCG tablet Take 1 tablet (175 mcg total) by mouth daily before breakfast.   No facility-administered encounter medications on file as of 08/06/2021.   ALLERGIES: No Known Allergies  VACCINATION STATUS:  There is no immunization history on file for this patient.  HPI Bethany Riddle is 80 y.o.  female who is being seen in follow-up for postsurgical hypothyroidism.  She has history of thyroid malignancy status post thyroidectomy.  She also has well-controlled type 2 diabetes on no medications.     PMD: Minna Antis, DO.  See previous visit notes.  She remains on Synthroid 175 mcg p.o. daily before breakfast.    She reports compliance with medication.  She continues to tolerate medication, presents with no new complaints.  She has steady weight.    She denies palpitations, tremor, heat intolerance.  She denies dysphagia, shortness of breath, no voice change.  Her thyroid/neck ultrasound from January 2022 is negative for any thyroid residual no recurrence. Her previsit thyroid function tests are consistent with appropriate replacement.  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  The details of her treatment with surgery is not available for review.     She denies dysphagia, shortness of breath, nor voice change. -Her prior of  thyroid/neck  ultrasound on July 27 2019 revealed evidence of prior thyroidectomy and otherwise negative ultrasound of the neck.  She denies any family history of thyroid malignancy.  In April 2022, she fell while trying to get out of her car and sustained fracture of one of her ribs.  Review of Systems  Limited as above.  Objective:    BP 120/78   Pulse 68   Ht '5\' 3"'$  (1.6 m)   Wt 256 lb (116.1 kg)   BMI 45.35 kg/m   Wt Readings from Last 3 Encounters:  08/06/21 256 lb (116.1 kg)  03/20/21 249 lb (112.9 kg)  08/21/20 269 lb (122 kg)    Physical Exam   Lab Results  Component Value Date   TSH 1.760 07/31/2021   TSH 0.773 03/12/2021   TSH 1.160 01/20/2021   TSH 2.130 09/10/2020   TSH 7.19 (A) 08/14/2020   TSH 2.88 04/29/2020   TSH 3.70 02/01/2020   TSH 1.35 10/31/2019   TSH 2.78 07/13/2019   TSH 1.17 05/22/2019   FREET4 1.61 07/31/2021   FREET4 1.91 (H) 03/12/2021   FREET4 1.88 (H) 01/20/2021   FREET4 1.52 09/10/2020   FREET4 1.5 04/29/2020   FREET4 1.2 02/01/2020   FREET4 1.1 10/31/2019   FREET4 1.1 07/13/2019       Assessment & Plan:   1. Postsurgical hypothyroidism 2. History of thyroid cancer 3.  Vitamin D deficiency  -Her previsit thyroid function tests are consistent with appropriate replacement.  She is advised to continue Synthroid 175 mcg p.o. daily before breakfast.    - We discussed about the correct intake of her thyroid hormone, on empty stomach at fasting, with water, separated by at least 30 minutes from breakfast and other medications,  and separated by more than 4 hours from calcium, iron, multivitamins, acid reflux medications (PPIs). -Patient is made aware of the fact that thyroid hormone replacement is needed for life, dose to be adjusted by periodic monitoring of thyroid function tests.   -She has history of thyroid malignancy status post surgery in 2012.  Records of her initial treatment for thyroid cancer are not available to review.   -Her recent  surveillance thyroid/neck ultrasound in January 2021 is negative for any residual thyroid tissue,mass,  or other sonographic abnormality.  -She will not need any further thyroid/neck imaging surveillance.   Regarding her history of type 2 diabetes, with recent A1c of 7.1%.  She did not tolerate Metformin.  Recent significant weight loss will help her maintain control without  intervention. - she acknowledges that there is a room for improvement in her food and drink choices. - Suggestion is made for her to avoid simple carbohydrates  from her diet including Cakes, Sweet Desserts, Ice Cream, Soda (diet and regular), Sweet Tea, Candies, Chips, Cookies, Store Bought Juices, Alcohol in Excess of  1-2 drinks a day, Artificial Sweeteners,  Coffee Creamer, and "Sugar-free" Products, Lemonade. This will help patient to have more stable blood glucose profile and potentially avoid unintended weight gain.  She will be considered for other options if A1c remains above 7% by next visit.  -She is advised to continue vitamin D3 5000 units daily.  She is advised to wear compression socks to help with her peripheral edema.   - I advised her  to maintain close follow up with Minna Antis, DO for primary care needs.    I spent 22 minutes in the care of the patient today including review of labs from Thyroid Function, CMP, and other relevant labs ; imaging/biopsy records (current and previous including abstractions from other facilities); face-to-face time discussing  her lab results and symptoms, medications doses, her options of short and long term treatment based on the latest standards of care / guidelines;   and documenting the encounter.  Neomia Riddle  participated in the discussions, expressed understanding, and voiced agreement with the above plans.  All questions were answered to her satisfaction. she is encouraged to contact clinic should she have any questions or concerns prior to her return  visit.    Follow up plan: Return in about 6 months (around 02/06/2022) for F/U with Pre-visit Labs, A1c -NV.   Glade Lloyd, MD [facility] [address] [place], [place] [address] Phone: [phone]  Fax: [phone]     08/06/2021, 1:48 PM  This note was partially dictated with voice recognition software. Similar sounding words can be transcribed inadequately or may not  be corrected upon review.

## 2021-10-04 IMAGING — US US THYROID
1 series · 14 of 25 positions shown · non-contrast
Comparison: None.

CLINICAL DATA: Thyroid malignancy status post thyroidectomy September 2011

EXAM:
THYROID ULTRASOUND
TECHNIQUE: Ultrasound examination of the thyroid gland and adjacent soft
tissues was performed.

[Series 1: us thyroid · 14 of 37 slices shown]
[im 1/37]
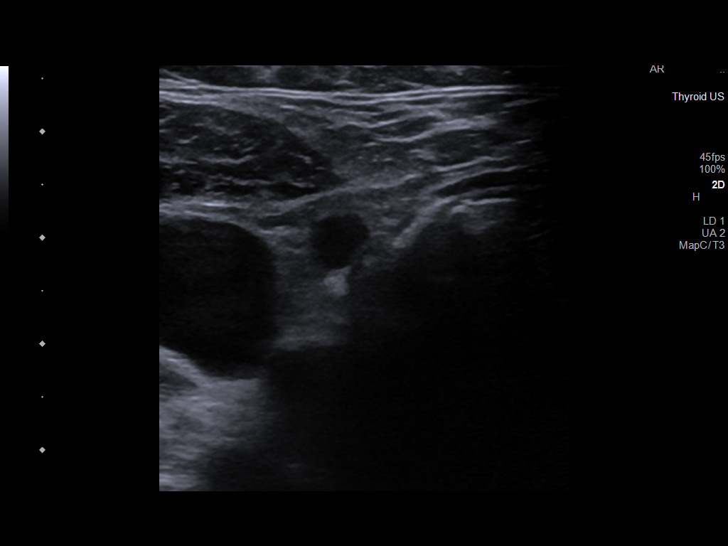
[im 4/37]
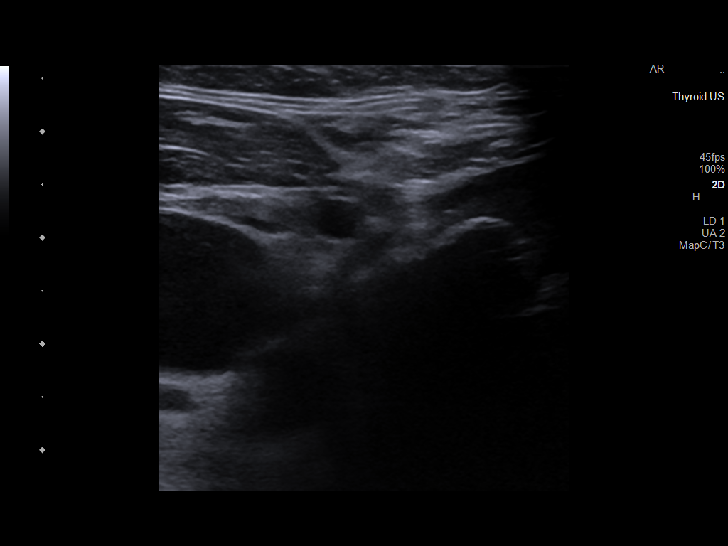
[im 7/37]
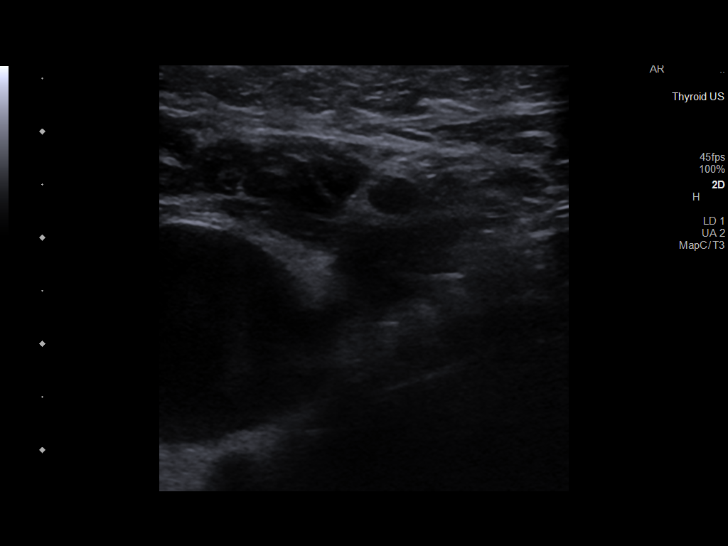
[im 10/37]
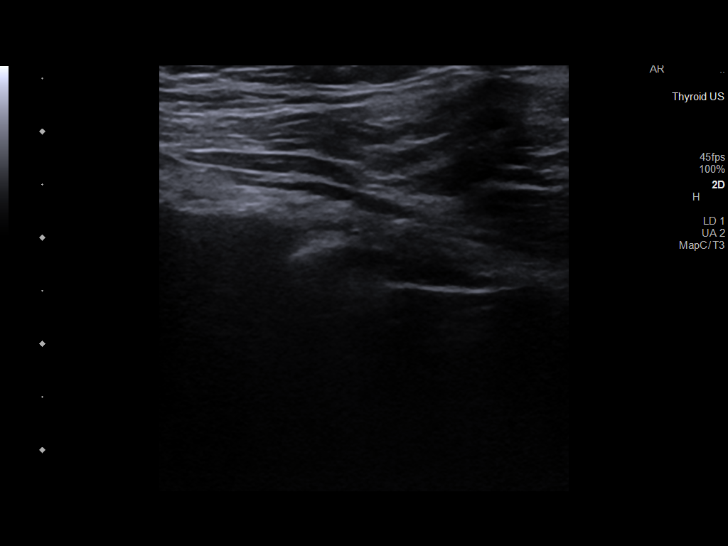
[im 13/37]
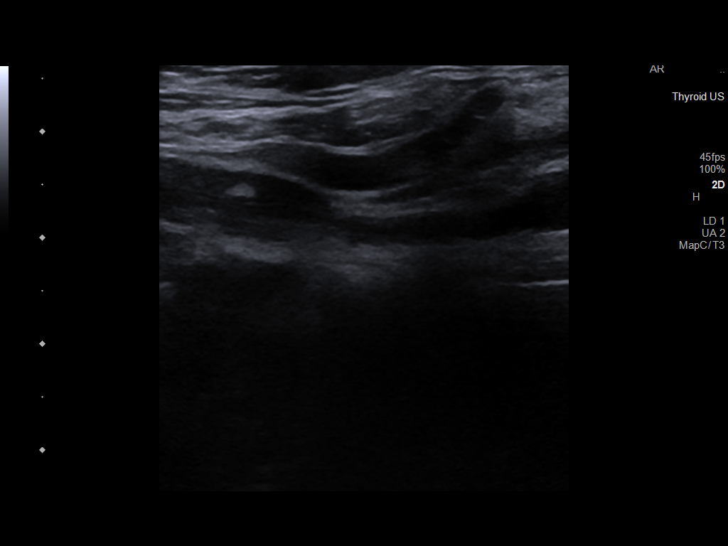
[im 14/37]
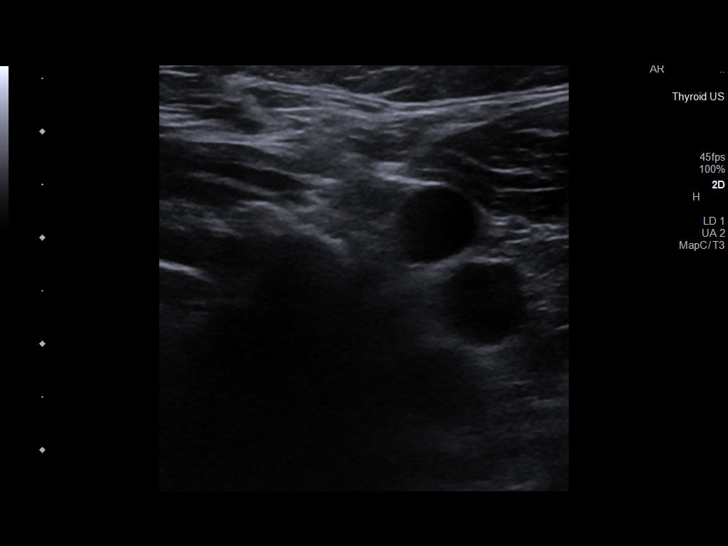
[im 17/37]
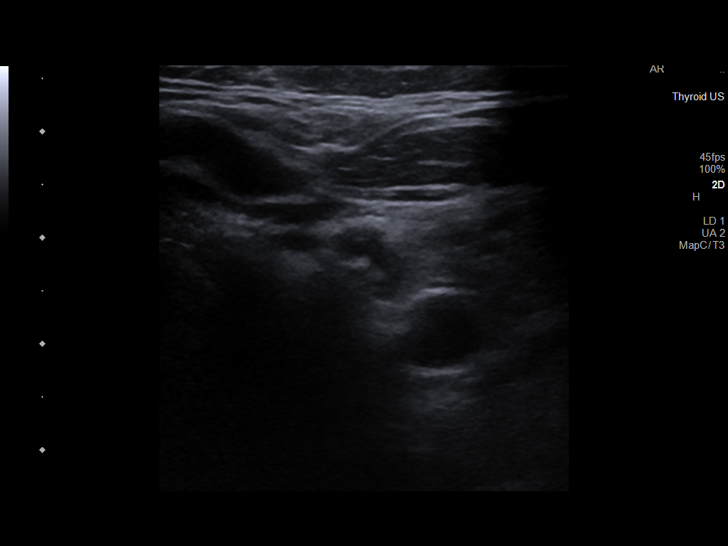
[im 20/37]
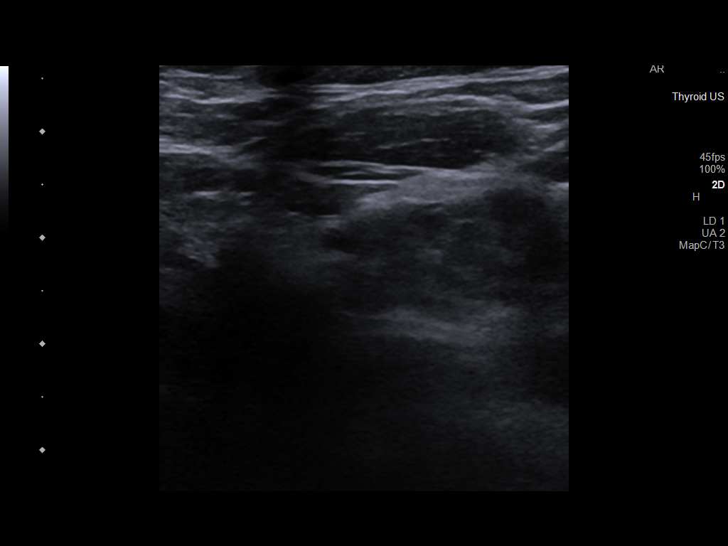
[im 23/37]
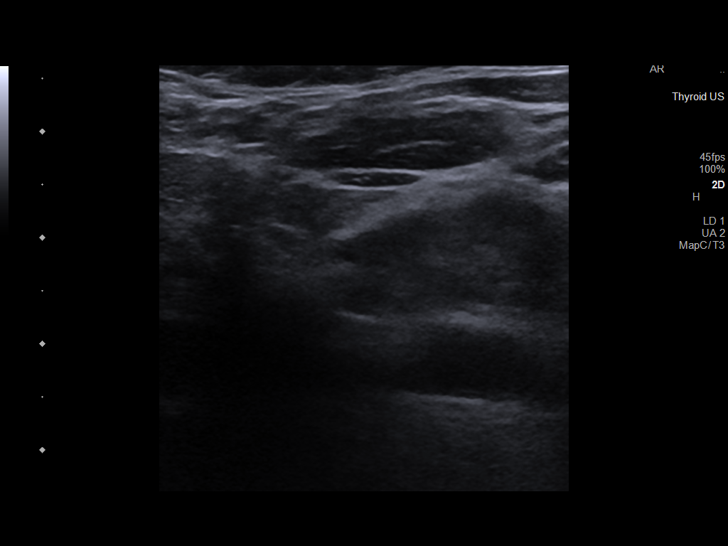
[im 25/37]
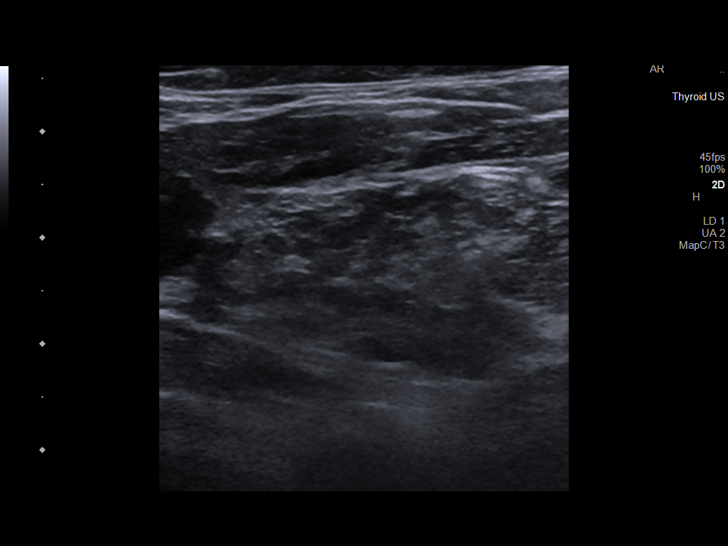
[im 28/37]
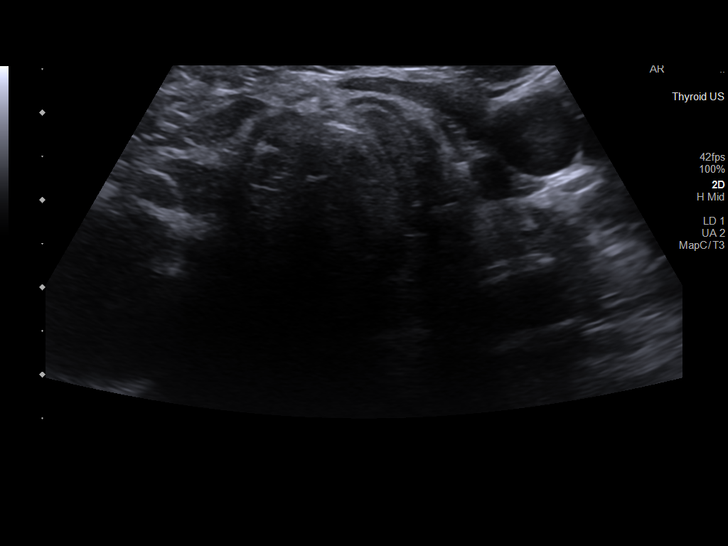
[im 31/37]
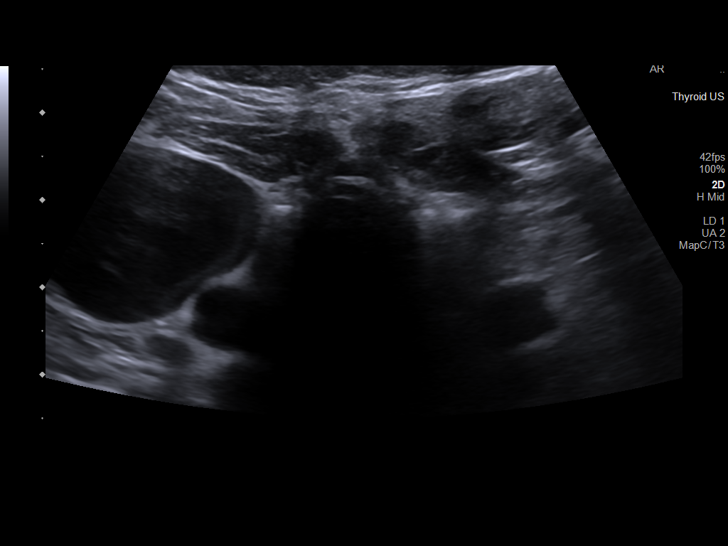
[im 34/37]
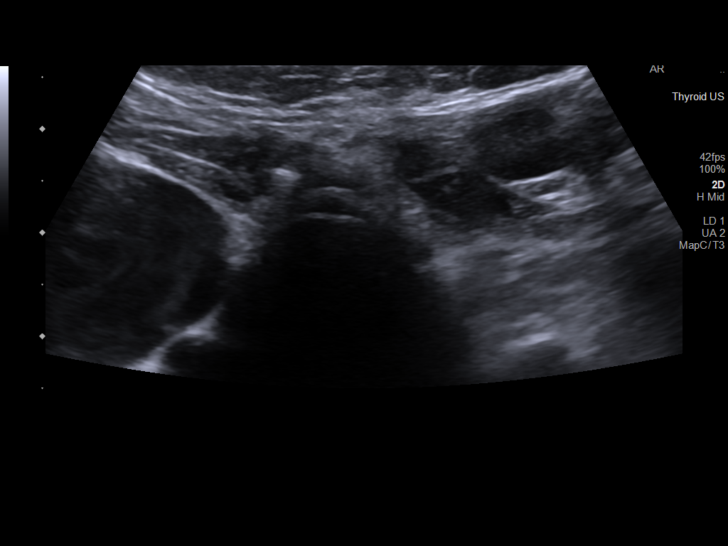
[im 37/37]
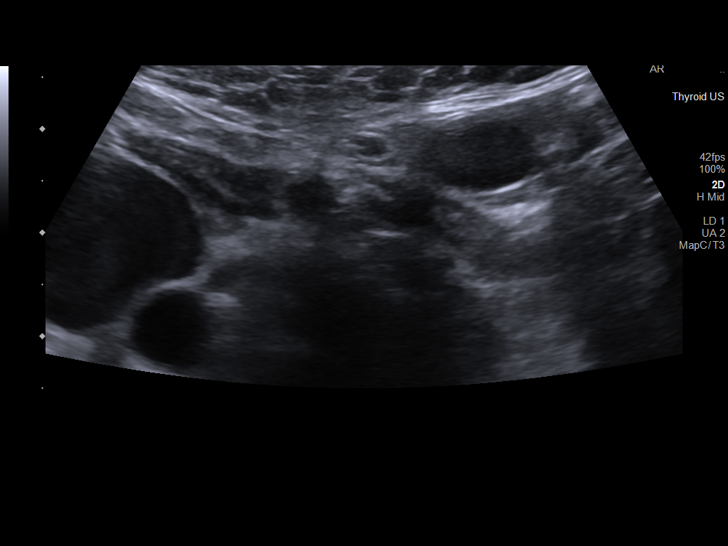

[14 of 25 positions shown; findings below may reference images not displayed]

FINDINGS: No residual thyroid tissue identified. No enlarged lymph nodes seen.
IMPRESSION: No sonographic evidence of recurrent thyroid malignancy.

The above is in keeping with the ACR TI-RADS recommendations - [HOSPITAL] 2014;[DATE].

## 2021-10-24 ENCOUNTER — Telehealth: Payer: Self-pay | Admitting: "Endocrinology

## 2021-10-24 NOTE — Telephone Encounter (Signed)
Pt left a VM requesting a nurse return her call

## 2021-10-24 NOTE — Telephone Encounter (Signed)
Left a message requesting a return call to the office. 

## 2021-10-24 NOTE — Telephone Encounter (Signed)
Called pt, she stated she sees Dr.Nida regarding her thyroid but would like to start seeing him also for her diabetes. Advised pt to have her PCP to send a referral with office notes and labs to the our office and we would get her scheduled. Pt voiced understanding.

## 2021-11-11 ENCOUNTER — Ambulatory Visit (INDEPENDENT_AMBULATORY_CARE_PROVIDER_SITE_OTHER): Payer: Medicare Other | Admitting: "Endocrinology

## 2021-11-11 ENCOUNTER — Encounter: Payer: Self-pay | Admitting: "Endocrinology

## 2021-11-11 ENCOUNTER — Other Ambulatory Visit: Payer: Self-pay

## 2021-11-11 VITALS — BP 118/52 | HR 64 | Ht 63.0 in | Wt 247.6 lb

## 2021-11-11 DIAGNOSIS — E1165 Type 2 diabetes mellitus with hyperglycemia: Secondary | ICD-10-CM | POA: Diagnosis not present

## 2021-11-11 DIAGNOSIS — E890 Postprocedural hypothyroidism: Secondary | ICD-10-CM | POA: Diagnosis not present

## 2021-11-11 DIAGNOSIS — Z85850 Personal history of malignant neoplasm of thyroid: Secondary | ICD-10-CM

## 2021-11-11 LAB — POCT GLYCOSYLATED HEMOGLOBIN (HGB A1C): HbA1c, POC (controlled diabetic range): 6.7 % (ref 0.0–7.0)

## 2021-11-11 NOTE — Patient Instructions (Signed)

## 2021-11-11 NOTE — Progress Notes (Signed)
11/11/2021, 5:32 PM  Endocrinology consult note   Subjective:    Patient ID: Bethany Riddle, female    DOB: 20-Jun-1941, PCP Minna Antis, DO   Past Medical History:  Diagnosis Date   Diabetes mellitus, type II ([place])    Hypothyroidism    Skin cancer    Past Surgical History:  Procedure Laterality Date   ABDOMINAL HYSTERECTOMY     HERNIA REPAIR     REPLACEMENT TOTAL KNEE     THYROIDECTOMY     Social History   Socioeconomic History   Marital status: Married    Spouse name: Not on file   Number of children: Not on file   Years of education: Not on file   Highest education level: Not on file  Occupational History   Not on file  Tobacco Use   Smoking status: Never   Smokeless tobacco: Never  Vaping Use   Vaping Use: Never used  Substance and Sexual Activity   Alcohol use: Not Currently   Drug use: Never   Sexual activity: Not on file  Other Topics Concern   Not on file  Social History Narrative   Not on file   Social Determinants of Health   Financial Resource Strain: Not on file  Food Insecurity: Not on file  Transportation Needs: Not on file  Physical Activity: Not on file  Stress: Not on file  Social Connections: Not on file   Family History  Problem Relation Age of Onset   Diabetes Mother    Thyroid disease Mother    Cancer Mother    Cancer Sister    Diabetes Sister    Thyroid disease Sister    Outpatient Encounter Medications as of 11/11/2021  Medication Sig   clopidogrel (PLAVIX) 75 MG tablet Take 1 tablet by mouth daily.   Ascorbic Acid (VITAMIN C) 1000 MG tablet Take 1,000 mg by mouth daily.   B Complex-C (SUPER B COMPLEX PO) Take by mouth daily.   Cholecalciferol (VITAMIN D3) 125 MCG (5000 UT) CAPS Take 1 capsule (5,000 Units total) by mouth daily.   Cyanocobalamin (VITAMIN B-12 PO) Take 1 tablet by mouth daily in the afternoon.   diltiazem (CARDIZEM CD) 120 MG 24 hr capsule Take 1  capsule by mouth daily. (Patient not taking: Reported on 11/11/2021)   doxycycline (VIBRAMYCIN) 100 MG capsule daily.   furosemide (LASIX) 40 MG tablet Take 1 tablet by mouth daily.   potassium chloride SA (KLOR-CON) 20 MEQ tablet Take 1 tablet by mouth daily.   rivaroxaban (XARELTO) 20 MG TABS tablet Take 0.5 tablets by mouth daily.   SYNTHROID 175 MCG tablet Take 1 tablet (175 mcg total) by mouth daily before breakfast.   TURMERIC PO Take 3 tablets by mouth daily.   [DISCONTINUED] famotidine (PEPCID) 20 MG tablet Take 1 tablet by mouth daily as needed. (Patient not taking: Reported on 08/06/2021)   [DISCONTINUED] sertraline (ZOLOFT) 25 MG tablet Take 1 tablet by mouth daily. (Patient not taking: Reported on 08/06/2021)   No facility-administered encounter medications on file as of 11/11/2021.   ALLERGIES: No Known Allergies  VACCINATION STATUS:  There is no immunization history on file for this patient.  HPI Bethany Riddle is 80 y.o.  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  She is referred for a new consult for diabetes.  She is known to have well-controlled type 2 diabetes with no complications, no medications.       PMD: Minna Antis, DO. -Her point-of-care A1c today 6.7%.  She is not tolerating medications including metformin.  He monitors once daily at fasting average blood glucose is 112 mg per DL.  She denies polyuria, polydipsia. See previous visit notes.  She remains on Synthroid 175 mcg p.o. daily before breakfast.    She reports compliance with medication.  She continues to tolerate medication, presents with no new complaints.  She has steady weight.    She denies palpitations, tremor, heat intolerance.  She denies dysphagia, shortness of breath, no voice change.  Her thyroid/neck ultrasound from January 2022 is negative for any thyroid residual no recurrence. Her previsit thyroid function tests are consistent with appropriate replacement.  She gives  history of total thyroidectomy in October 2012 for thyroid malignancy.  The details of her treatment with surgery is not available for review.     She denies dysphagia, shortness of breath, nor voice change. -Her prior of  thyroid/neck ultrasound on July 27 2019 revealed evidence of prior thyroidectomy and otherwise negative ultrasound of the neck.  She denies any family history of thyroid malignancy.  In April 2022, she fell while trying to get out of her car and sustained fracture of one of her ribs.  Review of Systems  Limited as above.  Objective:    BP (!) 118/52   Pulse 64   Ht 5\' 3"  (1.6 m)   Wt 247 lb 9.6 oz (112.3 kg)   BMI 43.86 kg/m   Wt Readings from Last 3 Encounters:  11/11/21 247 lb 9.6 oz (112.3 kg)  08/06/21 256 lb (116.1 kg)  03/20/21 249 lb (112.9 kg)    Physical Exam   Lab Results  Component Value Date   TSH 1.760 07/31/2021   TSH 0.773 03/12/2021   TSH 1.160 01/20/2021   TSH 2.130 09/10/2020   TSH 7.19 (A) 08/14/2020   TSH 2.88 04/29/2020   TSH 3.70 02/01/2020   TSH 1.35 10/31/2019   TSH 2.78 07/13/2019   TSH 1.17 05/22/2019   FREET4 1.61 07/31/2021   FREET4 1.91 (H) 03/12/2021   FREET4 1.88 (H) 01/20/2021   FREET4 1.52 09/10/2020   FREET4 1.5 04/29/2020   FREET4 1.2 02/01/2020   FREET4 1.1 10/31/2019   FREET4 1.1 07/13/2019       Assessment & Plan:   1.  Type 2 diabetes 2 postsurgical hypothyroidism 3. History of thyroid cancer 4.  Vitamin D deficiency   -Regarding her type-controlled with A1c of 6.7%, she will not need medication intervention for now.  I had a long discussion with her about pathogenesis of type 2 diabetes and means to control it to avoid complications.    She has room to maximize her lifestyle intervention.  Her previous A1c was 7.1%. - she acknowledges that there is a room for improvement in her food and drink choices. - Suggestion is made for her to avoid simple carbohydrates  from her diet including Cakes, Sweet  Desserts, Ice Cream, Soda (diet and regular), Sweet Tea, Candies, Chips, Cookies, Store Bought Juices, Alcohol in Excess of  1-2 drinks a day, Artificial Sweeteners,  Coffee Creamer, and "Sugar-free" Products, Lemonade. This will help patient to have more stable blood glucose profile and potentially avoid unintended weight gain.  -Whole  Foods, plant Predominant nutrition is discussed and recommended for her.   She will have repeat A1c during her next visit.   -She has history of thyroid malignancy status post surgery in 2012.  Records of her initial treatment for thyroid cancer are not available to review.   -Her recent surveillance thyroid/neck ultrasound in January 2021 is negative for any residual thyroid tissue,mass,  or other sonographic abnormality.  She will be considered for other options if A1c remains above 7% by next visit.  -She is advised to continue vitamin D3 5000 units daily.  She is advised to wear compression socks to help with her peripheral edema.  Regarding her postsurgical hypothyroidism: She is advised to remain on levothyroxine 175 mcg p.o. daily before breakfast.   - We discussed about the correct intake of her thyroid hormone, on empty stomach at fasting, with water, separated by at least 30 minutes from breakfast and other medications,  and separated by more than 4 hours from calcium, iron, multivitamins, acid reflux medications (PPIs). -Patient is made aware of the fact that thyroid hormone replacement is needed for life, dose to be adjusted by periodic monitoring of thyroid function tests.   - I advised her  to maintain close follow up with Minna Antis, DO for primary care needs.    I spent 50 minutes in the care of the patient today including review of labs from [place], Lipids, Thyroid Function, Hematology (current and previous including abstractions from other facilities); face-to-face time discussing  her blood glucose readings/logs, discussing hypoglycemia and  hyperglycemia episodes and symptoms, medications doses, her options of short and long term treatment based on the latest standards of care / guidelines;  discussion about incorporating lifestyle medicine;  and documenting the encounter.    Please refer to Patient Instructions for Blood Glucose Monitoring and Insulin/Medications Dosing Guide"  in media tab for additional information. Please  also refer to " Patient Self Inventory" in the Media  tab for reviewed elements of pertinent patient history.  Bethany Riddle participated in the discussions, expressed understanding, and voiced agreement with the above plans.  All questions were answered to her satisfaction. she is encouraged to contact clinic should she have any questions or concerns prior to her return visit.    Follow up plan: Return for Keep Reg. Appt. with Pre-visit Labs, A1c -NV.   Glade Lloyd, MD  [facility] [address] [place], [place] [address] Phone: [phone]  Fax: [phone]     11/11/2021, 5:32 PM  This note was partially dictated with voice recognition software. Similar sounding words can be transcribed inadequately or may not  be corrected upon review.

## 2022-01-19 ENCOUNTER — Telehealth: Payer: Self-pay | Admitting: "Endocrinology

## 2022-01-19 NOTE — Telephone Encounter (Signed)
Patient made aware.

## 2022-01-19 NOTE — Telephone Encounter (Signed)
Blood Sugars- Patient states they are higher than normal. Please Advise  1/27-121 1/28-113 1/29-113 1/30-159  She said she saw a new heart doctor about 3 weeks ago. She said that he put her on 10 mg of Jardiance. He told her he was starting it out low to see if she would be okay. She would like to know should this be increased?

## 2022-02-09 ENCOUNTER — Ambulatory Visit: Payer: Medicare Other | Admitting: "Endocrinology

## 2022-02-10 LAB — TSH: TSH: 0.386 u[IU]/mL — ABNORMAL LOW (ref 0.450–4.500)

## 2022-02-10 LAB — T4, FREE: Free T4: 1.48 ng/dL (ref 0.82–1.77)

## 2022-02-16 ENCOUNTER — Encounter: Payer: Self-pay | Admitting: "Endocrinology

## 2022-02-16 ENCOUNTER — Ambulatory Visit (INDEPENDENT_AMBULATORY_CARE_PROVIDER_SITE_OTHER): Payer: Medicare Other | Admitting: "Endocrinology

## 2022-02-16 ENCOUNTER — Other Ambulatory Visit: Payer: Self-pay

## 2022-02-16 VITALS — BP 130/70 | HR 65 | Ht 63.0 in | Wt 244.2 lb

## 2022-02-16 DIAGNOSIS — E890 Postprocedural hypothyroidism: Secondary | ICD-10-CM | POA: Diagnosis not present

## 2022-02-16 DIAGNOSIS — E1165 Type 2 diabetes mellitus with hyperglycemia: Secondary | ICD-10-CM | POA: Insufficient documentation

## 2022-02-16 DIAGNOSIS — Z85850 Personal history of malignant neoplasm of thyroid: Secondary | ICD-10-CM | POA: Diagnosis not present

## 2022-02-16 DIAGNOSIS — E559 Vitamin D deficiency, unspecified: Secondary | ICD-10-CM | POA: Diagnosis not present

## 2022-02-16 LAB — POCT GLYCOSYLATED HEMOGLOBIN (HGB A1C): HbA1c, POC (controlled diabetic range): 6.4 % (ref 0.0–7.0)

## 2022-02-16 MED ORDER — SYNTHROID 175 MCG PO TABS: 175.0000 ug | ORAL_TABLET | Freq: Every day | ORAL | 1 refills | Status: DC

## 2022-02-16 NOTE — Patient Instructions (Signed)

## 2022-02-16 NOTE — Progress Notes (Signed)
02/16/2022, 7:49 PM  Endocrinology consult note   Subjective:    Patient ID: Bethany Riddle, female    DOB: January 01, 1941, PCP Minna Antis, DO   Past Medical History:  Diagnosis Date   Diabetes mellitus, type II ([place])    Hypothyroidism    Skin cancer    Past Surgical History:  Procedure Laterality Date   ABDOMINAL HYSTERECTOMY     HERNIA REPAIR     REPLACEMENT TOTAL KNEE     THYROIDECTOMY     Social History   Socioeconomic History   Marital status: Married    Spouse name: Not on file   Number of children: Not on file   Years of education: Not on file   Highest education level: Not on file  Occupational History   Not on file  Tobacco Use   Smoking status: Never   Smokeless tobacco: Never  Vaping Use   Vaping Use: Never used  Substance and Sexual Activity   Alcohol use: Not Currently   Drug use: Never   Sexual activity: Not on file  Other Topics Concern   Not on file  Social History Narrative   Not on file   Social Determinants of Health   Financial Resource Strain: Not on file  Food Insecurity: Not on file  Transportation Needs: Not on file  Physical Activity: Not on file  Stress: Not on file  Social Connections: Not on file   Family History  Problem Relation Age of Onset   Diabetes Mother    Thyroid disease Mother    Cancer Mother    Cancer Sister    Diabetes Sister    Thyroid disease Sister    Outpatient Encounter Medications as of 02/16/2022  Medication Sig   Ascorbic Acid (VITAMIN C) 1000 MG tablet Take 1,000 mg by mouth daily.   B Complex-C (SUPER B COMPLEX PO) Take by mouth daily.   Cholecalciferol (VITAMIN D3) 125 MCG (5000 UT) CAPS Take 1 capsule (5,000 Units total) by mouth daily.   clopidogrel (PLAVIX) 75 MG tablet Take 1 tablet by mouth daily.   Cyanocobalamin (VITAMIN B-12 PO) Take 1 tablet by mouth daily in the afternoon.   diltiazem (CARDIZEM CD) 120 MG 24 hr capsule Take 1  capsule by mouth daily. (Patient not taking: Reported on 11/11/2021)   doxycycline (VIBRAMYCIN) 100 MG capsule daily.   furosemide (LASIX) 40 MG tablet Take 1 tablet by mouth daily.   JARDIANCE 10 MG TABS tablet Take 10 mg by mouth daily.   potassium chloride SA (KLOR-CON) 20 MEQ tablet Take 1 tablet by mouth daily.   rivaroxaban (XARELTO) 20 MG TABS tablet Take 0.5 tablets by mouth daily.   SYNTHROID 175 MCG tablet Take 1 tablet (175 mcg total) by mouth daily before breakfast.   [DISCONTINUED] SYNTHROID 175 MCG tablet Take 1 tablet (175 mcg total) by mouth daily before breakfast.   [DISCONTINUED] TURMERIC PO Take 3 tablets by mouth daily.   No facility-administered encounter medications on file as of 02/16/2022.   ALLERGIES: No Known Allergies  VACCINATION STATUS:  There is no immunization history on file for this patient.  HPI Bethany Riddle is 81 y.o. 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  She was recently referred again for newly diagnosed type 2 diabetes.    She was initiated on Jardiance 10 mg p.o. daily by her cardiologist in the interval.  Her point-of-care A1c is 6.4% today improving from 6.7% during her last check.       PMD: Minna Antis, DO. -She does not tolerate metformin.    See previous visit notes.  She remains on Synthroid 175 mcg p.o. daily before breakfast.    She reports compliance with medication.  She continues to tolerate medication, presents with no new complaints.  She has steady weight.    She denies palpitations, tremor, heat intolerance.  She denies dysphagia, shortness of breath, no voice change.  Her thyroid/neck ultrasound from January 2022 is negative for any thyroid residual no recurrence. Her previsit thyroid function tests are consistent with appropriate replacement.  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  The details of her treatment with surgery is not available for review.      She denies dysphagia, shortness of breath, nor voice change. -Her prior of  thyroid/neck ultrasound on July 27 2019 revealed evidence of prior thyroidectomy and otherwise negative ultrasound of the neck.  She denies any family history of thyroid malignancy.  In April 2022, she fell while trying to get out of her car and sustained fracture of one of her ribs.  Review of Systems  Limited as above.  Objective:    BP 130/70    Pulse 65    Ht 5\' 3"  (1.6 m)    Wt 244 lb 3.2 oz (110.8 kg)    BMI 43.26 kg/m   Wt Readings from Last 3 Encounters:  02/16/22 244 lb 3.2 oz (110.8 kg)  11/11/21 247 lb 9.6 oz (112.3 kg)  08/06/21 256 lb (116.1 kg)    Physical Exam   Lab Results  Component Value Date   TSH 0.386 (L) 02/09/2022   TSH 1.760 07/31/2021   TSH 0.773 03/12/2021   TSH 1.160 01/20/2021   TSH 2.130 09/10/2020   TSH 7.19 (A) 08/14/2020   TSH 2.88 04/29/2020   TSH 3.70 02/01/2020   TSH 1.35 10/31/2019   TSH 2.78 07/13/2019   FREET4 1.48 02/09/2022   FREET4 1.61 07/31/2021   FREET4 1.91 (H) 03/12/2021   FREET4 1.88 (H) 01/20/2021   FREET4 1.52 09/10/2020   FREET4 1.5 04/29/2020   FREET4 1.2 02/01/2020   FREET4 1.1 10/31/2019   FREET4 1.1 07/13/2019       Assessment & Plan:   1.  Type 2 diabetes 2 postsurgical hypothyroidism 3. History of thyroid cancer 4.  Vitamin D deficiency   -Regarding her controlled type 2 diabetes with point-of-care A1c of 6.4%, she will not need further escalation of medications.  She is advised to continue Jardiance 10 mg p.o. daily at breakfast.  Side effects and precautions discussed with her.    This patient would benefit the most from lifestyle medicine.  - she acknowledges that there is a room for improvement in her food and drink choices. - Suggestion is made for her to avoid simple carbohydrates  from her diet including Cakes, Sweet Desserts, Ice Cream, Soda (diet and regular), Sweet Tea, Candies, Chips, Cookies, Store Bought  Juices, Alcohol , Artificial Sweeteners,  Coffee Creamer, and "Sugar-free" Products, Lemonade. This will help patient to have more stable blood glucose profile and potentially avoid unintended weight gain.  The following Lifestyle Medicine recommendations according to [facility] of Lifestyle Medicine  (  [facility]) were discussed and and offered to patient and she  agrees to start the journey:  A. Whole Foods, Plant-Based Nutrition comprising of fruits and vegetables, plant-based proteins, whole-grain carbohydrates was discussed in detail with the patient.   A list for source of those nutrients were also provided to the patient.  Patient will use only water or unsweetened tea for hydration. B.  The need to stay away from risky substances including alcohol, smoking; obtaining 7 to 9 hours of restorative sleep, at least 150 minutes of moderate intensity exercise weekly, the importance of healthy social connections,  and stress management techniques were discussed. C.  A full color page of  Calorie density of various food groups per pound showing examples of each food groups was provided to the patient. She will have repeat A1c during her next visit.   -She has history of thyroid malignancy status post surgery in 2012.  Records of her initial treatment for thyroid cancer are not available to review.   -Her recent surveillance thyroid/neck ultrasound in January 2021 is negative for any residual thyroid tissue,mass,  or other sonographic abnormality.  -She is advised to continue vitamin D3 5000 units daily.  She is advised to wear compression socks to help with her peripheral edema.  Regarding her postsurgical hypothyroidism: Her previsit thyroid function tests are favorable for appropriate replacement.  She is advised to continue levothyroxine 175 mcg p.o. daily before breakfast.     - We discussed about the correct intake of her thyroid hormone, on empty stomach at fasting, with water, separated by at  least 30 minutes from breakfast and other medications,  and separated by more than 4 hours from calcium, iron, multivitamins, acid reflux medications (PPIs). -Patient is made aware of the fact that thyroid hormone replacement is needed for life, dose to be adjusted by periodic monitoring of thyroid function tests.  - I advised her  to maintain close follow up with Minna Antis, DO for primary care needs.    I spent 42 minutes in the care of the patient today including review of labs from [place], Lipids, Thyroid Function, Hematology (current and previous including abstractions from other facilities); face-to-face time discussing  her blood glucose readings/logs, discussing hypoglycemia and hyperglycemia episodes and symptoms, medications doses, her options of short and long term treatment based on the latest standards of care / guidelines;  discussion about incorporating lifestyle medicine;  and documenting the encounter.    Please refer to Patient Instructions for Blood Glucose Monitoring and Insulin/Medications Dosing Guide"  in media tab for additional information. Please  also refer to " Patient Self Inventory" in the Media  tab for reviewed elements of pertinent patient history.  Ahmiya Riddle participated in the discussions, expressed understanding, and voiced agreement with the above plans.  All questions were answered to her satisfaction. she is encouraged to contact clinic should she have any questions or concerns prior to her return visit.   Follow up plan: Return in about 6 months (around 08/16/2022) for F/U with Pre-visit Labs, A1c -NV.   Glade Lloyd, MD [facility] [address] [place], [place] [address] Phone: [phone]  Fax: [phone]     02/16/2022, 7:49 PM  This note was partially dictated with voice recognition software. Similar sounding words can be transcribed inadequately or may not  be corrected upon review.

## 2022-08-16 LAB — LIPID PANEL
Chol/HDL Ratio: 3.5 ratio (ref 0.0–4.4)
Cholesterol, Total: 177 mg/dL (ref 100–199)
HDL: 51 mg/dL (ref >39)
LDL Chol Calc (NIH): 102 mg/dL — ABNORMAL HIGH (ref 0–99)
Triglycerides: 136 mg/dL (ref 0–149)
VLDL Cholesterol Cal: 24 mg/dL (ref 5–40)

## 2022-08-16 LAB — COMPREHENSIVE METABOLIC PANEL
ALT: 9 IU/L (ref 0–32)
AST: 21 IU/L (ref 0–40)
Albumin/Globulin Ratio: 1.7 (ref 1.2–2.2)
Albumin: 4.2 g/dL (ref 3.7–4.7)
Alkaline Phosphatase: 78 IU/L (ref 44–121)
BUN/Creatinine Ratio: 18 (ref 12–28)
BUN: 16 mg/dL (ref 8–27)
Bilirubin Total: 0.3 mg/dL (ref 0.0–1.2)
CO2: 26 mmol/L (ref 20–29)
Calcium: 9.5 mg/dL (ref 8.7–10.3)
Chloride: 101 mmol/L (ref 96–106)
Creatinine, Ser: 0.91 mg/dL (ref 0.57–1.00)
Globulin, Total: 2.5 g/dL (ref 1.5–4.5)
Glucose: 112 mg/dL — ABNORMAL HIGH (ref 70–99)
Potassium: 4.6 mmol/L (ref 3.5–5.2)
Sodium: 142 mmol/L (ref 134–144)
Total Protein: 6.7 g/dL (ref 6.0–8.5)
eGFR: 63 mL/min/{1.73_m2} (ref >59)

## 2022-08-16 LAB — T4, FREE: Free T4: 1.33 ng/dL (ref 0.82–1.77)

## 2022-08-16 LAB — THYROGLOBULIN LEVEL: Thyroglobulin (TG-RIA): <2 ng/mL

## 2022-08-16 LAB — TSH: TSH: 2.14 u[IU]/mL (ref 0.450–4.500)

## 2022-08-19 ENCOUNTER — Ambulatory Visit (INDEPENDENT_AMBULATORY_CARE_PROVIDER_SITE_OTHER): Payer: Medicare Other | Admitting: "Endocrinology

## 2022-08-19 ENCOUNTER — Encounter: Payer: Self-pay | Admitting: "Endocrinology

## 2022-08-19 VITALS — BP 144/70 | HR 68 | Ht 63.0 in | Wt 250.6 lb

## 2022-08-19 DIAGNOSIS — E559 Vitamin D deficiency, unspecified: Secondary | ICD-10-CM

## 2022-08-19 DIAGNOSIS — E1165 Type 2 diabetes mellitus with hyperglycemia: Secondary | ICD-10-CM | POA: Diagnosis not present

## 2022-08-19 DIAGNOSIS — E890 Postprocedural hypothyroidism: Secondary | ICD-10-CM

## 2022-08-19 DIAGNOSIS — Z85850 Personal history of malignant neoplasm of thyroid: Secondary | ICD-10-CM | POA: Diagnosis not present

## 2022-08-19 DIAGNOSIS — E782 Mixed hyperlipidemia: Secondary | ICD-10-CM

## 2022-08-19 LAB — POCT GLYCOSYLATED HEMOGLOBIN (HGB A1C): HbA1c, POC (controlled diabetic range): 6.6 % (ref 0.0–7.0)

## 2022-08-19 NOTE — Patient Instructions (Signed)

## 2022-08-19 NOTE — Progress Notes (Signed)
08/19/2022, 3:01 PM  Endocrinology consult note   Subjective:    Patient ID: Bethany Riddle, female    DOB: Apr 28, 1941, PCP Minna Antis, DO   Past Medical History:  Diagnosis Date   Diabetes mellitus, type II ([place])    Hypothyroidism    Skin cancer    Past Surgical History:  Procedure Laterality Date   ABDOMINAL HYSTERECTOMY     HERNIA REPAIR     REPLACEMENT TOTAL KNEE     THYROIDECTOMY     Social History   Socioeconomic History   Marital status: Married    Spouse name: Not on file   Number of children: Not on file   Years of education: Not on file   Highest education level: Not on file  Occupational History   Not on file  Tobacco Use   Smoking status: Never   Smokeless tobacco: Never  Vaping Use   Vaping Use: Never used  Substance and Sexual Activity   Alcohol use: Not Currently   Drug use: Never   Sexual activity: Not on file  Other Topics Concern   Not on file  Social History Narrative   Not on file   Social Determinants of Health   Financial Resource Strain: Not on file  Food Insecurity: Not on file  Transportation Needs: Not on file  Physical Activity: Not on file  Stress: Not on file  Social Connections: Not on file   Family History  Problem Relation Age of Onset   Diabetes Mother    Thyroid disease Mother    Cancer Mother    Cancer Sister    Diabetes Sister    Thyroid disease Sister    Outpatient Encounter Medications as of 08/19/2022  Medication Sig   Ascorbic Acid (VITAMIN C) 1000 MG tablet Take 1,000 mg by mouth daily.   B Complex-C (SUPER B COMPLEX PO) Take by mouth daily.   Cholecalciferol (VITAMIN D3) 125 MCG (5000 UT) CAPS Take 1 capsule (5,000 Units total) by mouth daily.   clopidogrel (PLAVIX) 75 MG tablet Take 1 tablet by mouth daily.   Cyanocobalamin (VITAMIN B-12 PO) Take 1 tablet by mouth daily in the afternoon.   diltiazem (CARDIZEM CD) 120 MG 24 hr capsule Take 1  capsule by mouth daily. (Patient not taking: Reported on 11/11/2021)   doxycycline (VIBRAMYCIN) 100 MG capsule daily.   furosemide (LASIX) 40 MG tablet Take 1 tablet by mouth daily.   potassium chloride SA (KLOR-CON) 20 MEQ tablet Take 1 tablet by mouth daily.   rivaroxaban (XARELTO) 20 MG TABS tablet Take 0.5 tablets by mouth daily.   SYNTHROID 175 MCG tablet Take 1 tablet (175 mcg total) by mouth daily before breakfast.   [DISCONTINUED] JARDIANCE 10 MG TABS tablet Take 10 mg by mouth daily.   No facility-administered encounter medications on file as of 08/19/2022.   ALLERGIES: Allergies  Allergen Reactions   Jardiance [Empagliflozin]    Metformin And Related     VACCINATION STATUS:  There is no immunization history on file for this patient.  HPI Bethany Riddle is 81 y.o. 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  She was recently referred again for newly diagnosed type 2 diabetes.   PMD:  Minna Antis, DO. She was initiated on Jardiance 10 mg p.o. daily by her cardiologist in the interval.  She did not tolerate this medication causing repeated yeast infection and she stopped it.  Her point-of-care A1c today 6.6%.    -She does not tolerate metformin.  She is not on any medications for diabetes at this time.  See previous visit notes.  She remains on Synthroid 175 mcg p.o. daily before breakfast.    She reports compliance with medication.  She continues to tolerate medication, presents with no new complaints.  She has steady weight.    He has significant dyslipidemia with LDL at 102.  She is not on statins. She denies palpitations, tremor, heat intolerance.  She denies dysphagia, shortness of breath, no voice change.  Her thyroid/neck ultrasound from January 2022 is negative for any thyroid residual no recurrence. Her previsit thyroid function tests are consistent with appropriate replacement.  She gives history of total thyroidectomy in October 2012 for  thyroid malignancy.  The details of her treatment with surgery is not available for review.     She denies dysphagia, shortness of breath, nor voice change. -Her prior of  thyroid/neck ultrasound on July 27 2019 revealed evidence of prior thyroidectomy and otherwise negative ultrasound of the neck.  She denies any family history of thyroid malignancy.  In April 2022, she fell while trying to get out of her car and sustained fracture of one of her ribs.  Review of Systems  Limited as above.  Objective:    BP (!) 144/70   Pulse 68   Ht '5\' 3"'$  (1.6 m)   Wt 250 lb 9.6 oz (113.7 kg)   BMI 44.39 kg/m   Wt Readings from Last 3 Encounters:  08/19/22 250 lb 9.6 oz (113.7 kg)  02/16/22 244 lb 3.2 oz (110.8 kg)  11/11/21 247 lb 9.6 oz (112.3 kg)    Physical Exam   Lab Results  Component Value Date   TSH 2.140 08/10/2022   TSH 0.386 (L) 02/09/2022   TSH 1.760 07/31/2021   TSH 0.773 03/12/2021   TSH 1.160 01/20/2021   TSH 2.130 09/10/2020   TSH 7.19 (A) 08/14/2020   TSH 2.88 04/29/2020   TSH 3.70 02/01/2020   TSH 1.35 10/31/2019   FREET4 1.33 08/10/2022   FREET4 1.48 02/09/2022   FREET4 1.61 07/31/2021   FREET4 1.91 (H) 03/12/2021   FREET4 1.88 (H) 01/20/2021   FREET4 1.52 09/10/2020   FREET4 1.5 04/29/2020   FREET4 1.2 02/01/2020   FREET4 1.1 10/31/2019   FREET4 1.1 07/13/2019     Lipid Panel     Component Value Date/Time   CHOL 177 08/10/2022 1351   TRIG 136 08/10/2022 1351   HDL 51 08/10/2022 1351   CHOLHDL 3.5 08/10/2022 1351   LDLCALC 102 (H) 08/10/2022 1351   LABVLDL 24 08/10/2022 1351      Latest Ref Rng & Units 08/10/2022    1:51 PM  CMP  Glucose 70 - 99 mg/dL 112   BUN 8 - 27 mg/dL 16   Creatinine 0.57 - 1.00 mg/dL 0.91   Sodium 134 - 144 mmol/L 142   Potassium 3.5 - 5.2 mmol/L 4.6   Chloride 96 - 106 mmol/L 101   CO2 20 - 29 mmol/L 26   Calcium 8.7 - 10.3 mg/dL 9.5   Total Protein 6.0 - 8.5 g/dL 6.7   Total Bilirubin 0.0 - 1.2 mg/dL 0.3   Alkaline  Phos 44 - 121 IU/L 78  AST 0 - 40 IU/L 21   ALT 0 - 32 IU/L 9      Assessment & Plan:   1.  Type 2 diabetes 2 postsurgical hypothyroidism 3. History of thyroid cancer 4.  Vitamin D deficiency 5.  Hyperlipidemia   -Regarding her controlled type 2 diabetes with point-of-care A1c of 6.6%,.  She does not tolerate metformin or Jardiance.  At this time, she will be kept on observation only.  She is given whole food plant based intervention to address her multiple chronic diseases including diabetes, hyperlipidemia, obesity.    - she acknowledges that there is a room for improvement in her food and drink choices. - Suggestion is made for her to avoid simple carbohydrates  from her diet including Cakes, Sweet Desserts, Ice Cream, Soda (diet and regular), Sweet Tea, Candies, Chips, Cookies, Store Bought Juices, Alcohol , Artificial Sweeteners,  Coffee Creamer, and "Sugar-free" Products, Lemonade. This will help patient to have more stable blood glucose profile and potentially avoid unintended weight gain.  The following Lifestyle Medicine recommendations according to [place]  [facility]) were discussed and and offered to patient and she  agrees to start the journey:  A. Whole Foods, Plant-Based Nutrition comprising of fruits and vegetables, plant-based proteins, whole-grain carbohydrates was discussed in detail with the patient.   A list for source of those nutrients were also provided to the patient.  Patient will use only water or unsweetened tea for hydration. B.  The need to stay away from risky substances including alcohol, smoking; obtaining 7 to 9 hours of restorative sleep, at least 150 minutes of moderate intensity exercise weekly, the importance of healthy social connections,  and stress management techniques were discussed. C.  A full color page of  Calorie density of various food groups per pound showing examples of each food groups was provided to the  patient.   -She has history of thyroid malignancy status post surgery in 2012.  Records of her initial treatment for thyroid cancer are not available to review.   -Her recent surveillance thyroid/neck ultrasound in January 2021 is negative for any residual thyroid tissue,mass,  or other sonographic abnormality.  Regarding her postsurgical hypothyroidism: Her previsit thyroid function tests are favorable for appropriate replacement.  She is advised to continue levothyroxine 175 mcg p.o. daily before breakfast.     - We discussed about the correct intake of her thyroid hormone, on empty stomach at fasting, with water, separated by at least 30 minutes from breakfast and other medications,  and separated by more than 4 hours from calcium, iron, multivitamins, acid reflux medications (PPIs). -Patient is made aware of the fact that thyroid hormone replacement is needed for life, dose to be adjusted by periodic monitoring of thyroid function tests.  -She is advised to continue vitamin D3 5000 units daily.  She is advised to wear compression socks to help with her peripheral edema.  - I advised her  to maintain close follow up with Minna Antis, DO for primary care needs.   I spent 31 minutes in the care of the patient today including review of labs from Thyroid Function, CMP, and other relevant labs ; imaging/biopsy records (current and previous including abstractions from other facilities); face-to-face time discussing  her lab results and symptoms, medications doses, her options of short and long term treatment based on the latest standards of care / guidelines;   and documenting the encounter.  Bethany Riddle  participated in the discussions,  expressed understanding, and voiced agreement with the above plans.  All questions were answered to her satisfaction. she is encouraged to contact clinic should she have any questions or concerns prior to her return visit.    Follow up plan: Return in about  6 months (around 02/18/2023) for F/U with Pre-visit Labs, A1c -NV.   Glade Lloyd, MD [facility] [address] [place], [place] [address] Phone: [phone]  Fax: [phone]     08/19/2022, 3:01 PM  This note was partially dictated with voice recognition software. Similar sounding words can be transcribed inadequately or may not  be corrected upon review.

## 2022-09-12 ENCOUNTER — Other Ambulatory Visit: Payer: Self-pay | Admitting: "Endocrinology

## 2022-12-27 ENCOUNTER — Other Ambulatory Visit: Payer: Self-pay | Admitting: "Endocrinology

## 2022-12-29 ENCOUNTER — Other Ambulatory Visit: Payer: Self-pay

## 2022-12-29 MED ORDER — SYNTHROID 175 MCG PO TABS: ORAL_TABLET | ORAL | 0 refills | Status: DC

## 2023-02-13 LAB — TSH: TSH: 0.614 u[IU]/mL (ref 0.450–4.500)

## 2023-02-13 LAB — LIPID PANEL
Chol/HDL Ratio: 3.6 ratio (ref 0.0–4.4)
Cholesterol, Total: 165 mg/dL (ref 100–199)
HDL: 46 mg/dL (ref >39)
LDL Chol Calc (NIH): 100 mg/dL — ABNORMAL HIGH (ref 0–99)
Triglycerides: 101 mg/dL (ref 0–149)
VLDL Cholesterol Cal: 19 mg/dL (ref 5–40)

## 2023-02-13 LAB — T4, FREE: Free T4: 1.85 ng/dL — ABNORMAL HIGH (ref 0.82–1.77)

## 2023-02-18 ENCOUNTER — Ambulatory Visit (INDEPENDENT_AMBULATORY_CARE_PROVIDER_SITE_OTHER): Payer: Medicare Other | Admitting: "Endocrinology

## 2023-02-18 ENCOUNTER — Encounter: Payer: Self-pay | Admitting: "Endocrinology

## 2023-02-18 VITALS — BP 142/64 | HR 64 | Ht 63.0 in | Wt 250.2 lb

## 2023-02-18 DIAGNOSIS — E559 Vitamin D deficiency, unspecified: Secondary | ICD-10-CM

## 2023-02-18 DIAGNOSIS — Z85850 Personal history of malignant neoplasm of thyroid: Secondary | ICD-10-CM | POA: Diagnosis not present

## 2023-02-18 DIAGNOSIS — E1165 Type 2 diabetes mellitus with hyperglycemia: Secondary | ICD-10-CM | POA: Diagnosis not present

## 2023-02-18 DIAGNOSIS — E782 Mixed hyperlipidemia: Secondary | ICD-10-CM | POA: Diagnosis not present

## 2023-02-18 DIAGNOSIS — E890 Postprocedural hypothyroidism: Secondary | ICD-10-CM

## 2023-02-18 LAB — POCT GLYCOSYLATED HEMOGLOBIN (HGB A1C): HbA1c, POC (controlled diabetic range): 6.6 % (ref 0.0–7.0)

## 2023-02-18 MED ORDER — SYNTHROID 150 MCG PO TABS: 150.0000 ug | ORAL_TABLET | Freq: Every day | ORAL | 1 refills | Status: DC

## 2023-02-18 NOTE — Progress Notes (Signed)
02/18/2023, 5:39 PM  Endocrinology consult note   Subjective:    Patient ID: Bethany Riddle, female    DOB: Aug 29, 1941, PCP Minna Antis, DO   Past Medical History:  Diagnosis Date   Diabetes mellitus, type II ([place])    Hypothyroidism    Skin cancer    Past Surgical History:  Procedure Laterality Date   ABDOMINAL HYSTERECTOMY     CATARACT EXTRACTION     HERNIA REPAIR     REPLACEMENT TOTAL KNEE     SKIN BIOPSY     THYROIDECTOMY     Social History   Socioeconomic History   Marital status: Married    Spouse name: Not on file   Number of children: Not on file   Years of education: Not on file   Highest education level: Not on file  Occupational History   Not on file  Tobacco Use   Smoking status: Never   Smokeless tobacco: Never  Vaping Use   Vaping Use: Never used  Substance and Sexual Activity   Alcohol use: Not Currently   Drug use: Never   Sexual activity: Not on file  Other Topics Concern   Not on file  Social History Narrative   Not on file   Social Determinants of Health   Financial Resource Strain: Not on file  Food Insecurity: Not on file  Transportation Needs: Not on file  Physical Activity: Not on file  Stress: Not on file  Social Connections: Not on file   Family History  Problem Relation Age of Onset   Diabetes Mother    Thyroid disease Mother    Cancer Mother    Cancer Sister    Diabetes Sister    Thyroid disease Sister    Outpatient Encounter Medications as of 02/18/2023  Medication Sig   SYNTHROID 150 MCG tablet Take 1 tablet (150 mcg total) by mouth daily before breakfast.   Ascorbic Acid (VITAMIN C) 1000 MG tablet Take 1,000 mg by mouth daily.   B Complex-C (SUPER B COMPLEX PO) Take by mouth daily.   Cholecalciferol (VITAMIN D3) 125 MCG (5000 UT) CAPS Take 1 capsule (5,000 Units total) by mouth daily.   clopidogrel (PLAVIX) 75 MG tablet Take 1 tablet by mouth daily.    Cyanocobalamin (VITAMIN B-12 PO) Take 1 tablet by mouth daily in the afternoon.   diltiazem (CARDIZEM CD) 120 MG 24 hr capsule Take 1 capsule by mouth daily. (Patient not taking: Reported on 11/11/2021)   doxycycline (VIBRAMYCIN) 100 MG capsule daily.   furosemide (LASIX) 40 MG tablet Take 1 tablet by mouth daily.   potassium chloride SA (KLOR-CON) 20 MEQ tablet Take 1 tablet by mouth daily.   rivaroxaban (XARELTO) 20 MG TABS tablet Take 0.5 tablets by mouth daily.   [DISCONTINUED] SYNTHROID 175 MCG tablet TAKE 1 TABLET BY MOUTH BEFORE BREAKFAST   No facility-administered encounter medications on file as of 02/18/2023.   ALLERGIES: Allergies  Allergen Reactions   Jardiance [Empagliflozin]    Metformin And Related     VACCINATION STATUS:  There is no immunization history on file for this patient.  HPI Bethany Riddle is 81 y.o. 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  She was  recently referred again for newly diagnosed type 2 diabetes.   PMD: Minna Antis, DO. She was given the Jardiance 10 mg p.o. during previous attempt which she did not tolerate.  She is currently not taking any medications for diabetes.   Her point-of-care A1c remains at 6.6%.     -She does not tolerate metformin.  She is not on any medications for diabetes at this time.  See previous visit notes.  She remains on Synthroid 175 mcg p.o. daily before breakfast.    She reports compliance with medication.  She continues to tolerate medication, presents with no new complaints.  She has steady weight.    Her previsit thyroid function tests are consistent with slight over-replacement. He has significant dyslipidemia with LDL at 102.  She is not on statins. She denies palpitations, tremor, heat intolerance.  She denies dysphagia, shortness of breath, no voice change.  Her thyroid/neck ultrasound from January 2022 is negative for any thyroid residual no recurrence.  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  The details of her treatment with surgery is not available for review.     She denies dysphagia, shortness of breath, nor voice change. -Her prior of  thyroid/neck ultrasound on July 27 2019 revealed evidence of prior thyroidectomy and otherwise negative ultrasound of the neck.  She denies any family history of thyroid malignancy.  In April 2022, she fell while trying to get out of her car and sustained fracture of one of her ribs.  Review of Systems  Limited as above.  Objective:    BP (!) 142/64 Comment: 136/66 L arm with manuel cuff  Pulse 64   Ht '5\' 3"'$  (1.6 m)   Wt 250 lb 3.2 oz (113.5 kg)   BMI 44.32 kg/m   Wt Readings from Last 3 Encounters:  02/18/23 250 lb 3.2 oz (113.5 kg)  08/19/22 250 lb 9.6 oz (113.7 kg)  02/16/22 244 lb 3.2 oz (110.8 kg)    Physical Exam   Lab Results  Component Value Date   TSH 0.614 02/12/2023   TSH 2.140 08/10/2022   TSH 0.386 (L) 02/09/2022   TSH 1.760 07/31/2021   TSH 0.773 03/12/2021   TSH 1.160 01/20/2021   TSH 2.130 09/10/2020   TSH 7.19 (A) 08/14/2020   TSH 2.88 04/29/2020   TSH 3.70 02/01/2020   FREET4 1.85 (H) 02/12/2023   FREET4 1.33 08/10/2022   FREET4 1.48 02/09/2022   FREET4 1.61 07/31/2021   FREET4 1.91 (H) 03/12/2021   FREET4 1.88 (H) 01/20/2021   FREET4 1.52 09/10/2020   FREET4 1.5 04/29/2020   FREET4 1.2 02/01/2020   FREET4 1.1 10/31/2019     Lipid Panel     Component Value Date/Time   CHOL 165 02/12/2023 0823   TRIG 101 02/12/2023 0823   HDL 46 02/12/2023 0823   CHOLHDL 3.6 02/12/2023 0823   LDLCALC 100 (H) 02/12/2023 0823   LABVLDL 19 02/12/2023 0823      Latest Ref Rng & Units 08/10/2022    1:51 PM  CMP  Glucose 70 - 99 mg/dL 112   BUN 8 - 27 mg/dL 16   Creatinine 0.57 - 1.00 mg/dL 0.91   Sodium 134 - 144 mmol/L 142   Potassium 3.5 - 5.2 mmol/L 4.6   Chloride 96 - 106 mmol/L 101   CO2 20 - 29 mmol/L 26   Calcium 8.7 - 10.3 mg/dL 9.5   Total  Protein 6.0 - 8.5 g/dL 6.7  Total Bilirubin 0.0 - 1.2 mg/dL 0.3   Alkaline Phos 44 - 121 IU/L 78   AST 0 - 40 IU/L 21   ALT 0 - 32 IU/L 9      Assessment & Plan:   1.  Type 2 diabetes 2 postsurgical hypothyroidism 3. History of thyroid cancer 4.  Vitamin D deficiency 5.  Hyperlipidemia 6.  Obesity   -Regarding her controlled type 2 diabetes with point-of-care A1c of 6.6%. She did not tolerate Jardiance.  At this time she will be kept on expectant management without medications.  She is given whole food plant based intervention to address her multiple chronic diseases including diabetes, hyperlipidemia, obesity.   - she acknowledges that there is a room for improvement in her food and drink choices. - Suggestion is made for her to avoid simple carbohydrates  from her diet including Cakes, Sweet Desserts, Ice Cream, Soda (diet and regular), Sweet Tea, Candies, Chips, Cookies, Store Bought Juices, Alcohol , Artificial Sweeteners,  Coffee Creamer, and "Sugar-free" Products, Lemonade. This will help patient to have more stable blood glucose profile and potentially avoid unintended weight gain.  The following Lifestyle Medicine recommendations according to [place]  [facility]) were discussed and and offered to patient and she  agrees to start the journey:  A. Whole Foods, Plant-Based Nutrition comprising of fruits and vegetables, plant-based proteins, whole-grain carbohydrates was discussed in detail with the patient.   A list for source of those nutrients were also provided to the patient.  Patient will use only water or unsweetened tea for hydration. B.  The need to stay away from risky substances including alcohol, smoking; obtaining 7 to 9 hours of restorative sleep, at least 150 minutes of moderate intensity exercise weekly, the importance of healthy social connections,  and stress management techniques were discussed. C.  A full color page of  Calorie density of  various food groups per pound showing examples of each food groups was provided to the patient.   -She has history of thyroid malignancy status post surgery in 2012.  Records of her initial treatment for thyroid cancer are not available to review.   -Her recent surveillance thyroid/neck ultrasound in January 2021 is negative for any residual thyroid tissue,mass,  or other sonographic abnormality.  Regarding her postsurgical hypothyroidism: Her previsit thyroid function tests are consistent with slight over-replacement.  I discussed and lowered her levothyroxine to 150 mcg p.o. daily before breakfast.     - We discussed about the correct intake of her thyroid hormone, on empty stomach at fasting, with water, separated by at least 30 minutes from breakfast and other medications,  and separated by more than 4 hours from calcium, iron, multivitamins, acid reflux medications (PPIs). -Patient is made aware of the fact that thyroid hormone replacement is needed for life, dose to be adjusted by periodic monitoring of thyroid function tests.  -She is advised to continue vitamin D3 5000 units daily.  She is advised to wear compression socks to help with her peripheral edema.  - I advised her  to maintain close follow up with Minna Antis, DO for primary care needs.   I spent  25  minutes in the care of the patient today including review of labs from [facility], Lipids, Thyroid Function, Hematology (current and previous including abstractions from other facilities); face-to-face time discussing  her blood glucose readings/logs, discussing hypoglycemia and hyperglycemia episodes and symptoms, medications doses, her options of short and long term treatment  based on the latest standards of care / guidelines;  discussion about incorporating lifestyle medicine;  and documenting the encounter. Risk reduction counseling performed per USPSTF guidelines to reduce  obesity and cardiovascular risk factors.     Please refer  to Patient Instructions for Blood Glucose Monitoring and Insulin/Medications Dosing Guide"  in media tab for additional information. Please  also refer to " Patient Self Inventory" in the Media  tab for reviewed elements of pertinent patient history.  Bethany Riddle participated in the discussions, expressed understanding, and voiced agreement with the above plans.  All questions were answered to her satisfaction. she is encouraged to contact clinic should she have any questions or concerns prior to her return visit.   Follow up plan: Return in about 6 months (around 08/19/2023) for Fasting Labs  in AM B4 8, A1c -NV.   Glade Lloyd, MD [facility] [address] [place], [place] [address] Phone: [phone]  Fax: [phone]     02/18/2023, 5:39 PM  This note was partially dictated with voice recognition software. Similar sounding words can be transcribed inadequately or may not  be corrected upon review.

## 2023-02-18 NOTE — Patient Instructions (Signed)
                                     Advice for Weight Management  -For most of us the best way to lose weight is by diet management. Generally speaking, diet management means consuming less calories intentionally which over time brings about progressive weight loss.  This can be achieved more effectively by avoiding ultra processed carbohydrates, processed meats, unhealthy fats.    It is critically important to know your numbers: how much calorie you are consuming and how much calorie you need. More importantly, our carbohydrates sources should be unprocessed naturally occurring  complex starch food items.  It is always important to balance nutrition also by  appropriate intake of proteins (mainly plant-based), healthy fats/oils, plenty of fruits and vegetables.   -The American College of Lifestyle Medicine (ACL M) recommends nutrition derived mostly from Whole Food, Plant Predominant Sources example an apple instead of applesauce or apple pie. Eat Plenty of vegetables, Mushrooms, fruits, Legumes, Whole Grains, Nuts, seeds in lieu of processed meats, processed snacks/pastries red meat, poultry, eggs.  Use only water or unsweetened tea for hydration.  The College also recommends the need to stay away from risky substances including alcohol, smoking; obtaining 7-9 hours of restorative sleep, at least 150 minutes of moderate intensity exercise weekly, importance of healthy social connections, and being mindful of stress and seek help when it is overwhelming.    -Sticking to a routine mealtime to eat 3 meals a day and avoiding unnecessary snacks is shown to have a big role in weight control. Under normal circumstances, the only time we burn stored energy is when we are hungry, so allow  some hunger to take place- hunger means no food between appropriate meal times, only water.  It is not advisable to starve.   -It is better to avoid simple carbohydrates including:  Cakes, Sweet Desserts, Ice Cream, Soda (diet and regular), Sweet Tea, Candies, Chips, Cookies, Store Bought Juices, Alcohol in Excess of  1-2 drinks a day, Lemonade,  Artificial Sweeteners, Doughnuts, Coffee Creamers, "Sugar-free" Products, etc, etc.  This is not a complete list.....    -Consulting with certified diabetes educators is proven to provide you with the most accurate and current information on diet.  Also, you may be  interested in discussing diet options/exchanges , we can schedule a visit with Bethany Riddle, RDN, CDE for individualized nutrition education.  -Exercise: If you are able: 30 -60 minutes a day ,4 days a week, or 150 minutes of moderate intensity exercise weekly.    The longer the better if tolerated.  Combine stretch, strength, and aerobic activities.  If you were told in the past that you have high risk for cardiovascular diseases, or if you are currently symptomatic, you may seek evaluation by your heart doctor prior to initiating moderate to intense exercise programs.                                  Additional Care Considerations for Diabetes/Prediabetes   -Diabetes  is a chronic disease.  The most important care consideration is regular follow-up with your diabetes care provider with the goal being avoiding or delaying its complications and to take advantage of advances in medications and technology.  If appropriate actions are taken early enough, type 2 diabetes can even be   reversed.  Seek information from the right source.  - Whole Food, Plant Predominant Nutrition is highly recommended: Eat Plenty of vegetables, Mushrooms, fruits, Legumes, Whole Grains, Nuts, seeds in lieu of processed meats, processed snacks/pastries red meat, poultry, eggs as recommended by American College of  Lifestyle Medicine (ACLM).  -Type 2 diabetes is known to coexist with other important comorbidities such as high blood pressure and high cholesterol.  It is critical to control not only the  diabetes but also the high blood pressure and high cholesterol to minimize and delay the risk of complications including coronary artery disease, stroke, amputations, blindness, etc.  The good news is that this diet recommendation for type 2 diabetes is also very helpful for managing high cholesterol and high blood blood pressure.  - Studies showed that people with diabetes will benefit from a class of medications known as ACE inhibitors and statins.  Unless there are specific reasons not to be on these medications, the standard of care is to consider getting one from these groups of medications at an optimal doses.  These medications are generally considered safe and proven to help protect the heart and the kidneys.    - People with diabetes are encouraged to initiate and maintain regular follow-up with eye doctors, foot doctors, dentists , and if necessary heart and kidney doctors.     - It is highly recommended that people with diabetes quit smoking or stay away from smoking, and get yearly  flu vaccine and pneumonia vaccine at least every 5 years.  See above for additional recommendations on exercise, sleep, stress management , and healthy social connections.      

## 2023-08-19 ENCOUNTER — Ambulatory Visit (INDEPENDENT_AMBULATORY_CARE_PROVIDER_SITE_OTHER): Payer: Medicare Other | Admitting: "Endocrinology

## 2023-08-19 ENCOUNTER — Encounter: Payer: Self-pay | Admitting: "Endocrinology

## 2023-08-19 VITALS — BP 138/60 | HR 64 | Ht 63.0 in | Wt 250.2 lb

## 2023-08-19 DIAGNOSIS — E782 Mixed hyperlipidemia: Secondary | ICD-10-CM

## 2023-08-19 DIAGNOSIS — Z6841 Body Mass Index (BMI) 40.0 and over, adult: Secondary | ICD-10-CM

## 2023-08-19 DIAGNOSIS — E890 Postprocedural hypothyroidism: Secondary | ICD-10-CM | POA: Diagnosis not present

## 2023-08-19 DIAGNOSIS — E1165 Type 2 diabetes mellitus with hyperglycemia: Secondary | ICD-10-CM

## 2023-08-19 DIAGNOSIS — Z85850 Personal history of malignant neoplasm of thyroid: Secondary | ICD-10-CM

## 2023-08-19 LAB — POCT GLYCOSYLATED HEMOGLOBIN (HGB A1C): HbA1c, POC (controlled diabetic range): 6.5 % (ref 0.0–7.0)

## 2023-08-19 MED ORDER — SYNTHROID 175 MCG PO TABS: 175.0000 ug | ORAL_TABLET | Freq: Every day | ORAL | 1 refills | Status: DC

## 2023-08-19 NOTE — Progress Notes (Signed)
08/19/2023, 1:56 PM  Endocrinology consult note   Subjective:    Patient ID: Bethany Riddle, female    DOB: [birth year], PCP Patient, No Pcp Per   Past Medical History:  Diagnosis Date   Diabetes mellitus, type II (HCC)    Hypothyroidism    Skin cancer    Past Surgical History:  Procedure Laterality Date   ABDOMINAL HYSTERECTOMY     CATARACT EXTRACTION     FINGER NAIL SURGERY     HERNIA REPAIR     REPLACEMENT TOTAL KNEE     SKIN BIOPSY     THYROIDECTOMY     Social History   Socioeconomic History   Marital status: Married    Spouse name: Not on file   Number of children: Not on file   Years of education: Not on file   Highest education level: Not on file  Occupational History   Not on file  Tobacco Use   Smoking status: Never   Smokeless tobacco: Never  Vaping Use   Vaping status: Never Used  Substance and Sexual Activity   Alcohol use: Not Currently   Drug use: Never   Sexual activity: Not on file  Other Topics Concern   Not on file  Social History Narrative   Not on file   Social Determinants of Health   Financial Resource Strain: Not on file  Food Insecurity: Not on file  Transportation Needs: Not on file  Physical Activity: Not on file  Stress: Not on file  Social Connections: Not on file   Family History  Problem Relation Age of Onset   Diabetes Mother    Thyroid disease Mother    Cancer Mother    Cancer Sister    Diabetes Sister    Thyroid disease Sister    Outpatient Encounter Medications as of 08/19/2023  Medication Sig   SYNTHROID 175 MCG tablet Take 1 tablet (175 mcg total) by mouth daily before breakfast.   Ascorbic Acid (VITAMIN C) 1000 MG tablet Take 1,000 mg by mouth daily.   B Complex-C (SUPER B COMPLEX PO) Take by mouth daily.   Cholecalciferol (VITAMIN D3) 125 MCG (5000 UT) CAPS Take 1 capsule (5,000 Units total) by mouth daily.   clopidogrel (PLAVIX) 75 MG tablet Take 1  tablet by mouth daily.   Cyanocobalamin (VITAMIN B-12 PO) Take 1 tablet by mouth daily in the afternoon.   diltiazem (CARDIZEM CD) 120 MG 24 hr capsule Take 1 capsule by mouth daily. (Patient not taking: Reported on 11/11/2021)   doxycycline (VIBRAMYCIN) 100 MG capsule daily.   furosemide (LASIX) 40 MG tablet Take 1 tablet by mouth daily.   potassium chloride SA (KLOR-CON) 20 MEQ tablet Take 1 tablet by mouth daily.   rivaroxaban (XARELTO) 20 MG TABS tablet Take 0.5 tablets by mouth daily.   [DISCONTINUED] SYNTHROID 150 MCG tablet Take 1 tablet (150 mcg total) by mouth daily before breakfast.   No facility-administered encounter medications on file as of 08/19/2023.   ALLERGIES: Allergies  Allergen Reactions   Jardiance [Empagliflozin]    Metformin And Related     VACCINATION STATUS:  There is no immunization history on file for this patient.  HPI Bethany Riddle is 82 y.o. female  who follows in this clinic for postsurgical hypothyroidism, type 2 diabetes, hyperlipidemia..   She is currently not taking any medications for diabetes.   Her point-of-care A1c is 6.5%.  She did not tolerate SGLT2 inhibitors in the past.  -She does not tolerate metformin.   See previous visit notes.  She remains on Synthroid 150 mcg p.o. daily before breakfast.    She reports compliance with medication.  She continues to tolerate this medication, presents with no new complaints.  She has steady weight, unable to achieve weight loss.  He has significant dyslipidemia with LDL at 109.  She is not on statins. She denies palpitations, tremor, heat intolerance.  She denies dysphagia, shortness of breath, no voice change.  Her thyroid/neck ultrasound from January 2022 is negative for any thyroid residual no recurrence.  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  The details of her treatment with surgery is not available for review.     She denies dysphagia, shortness of  breath, nor voice change. -Her prior of  thyroid/neck ultrasound on July 27 2019 revealed evidence of prior thyroidectomy and otherwise negative ultrasound of the neck.  She denies any family history of thyroid malignancy.  In April 2022, she fell while trying to get out of her car and sustained fracture of one of her ribs.  Review of Systems  Limited as above.  Objective:    BP 138/60   Pulse 64   Ht 5\' 3"  (1.6 m)   Wt 250 lb 3.2 oz (113.5 kg)   BMI 44.32 kg/m   Wt Readings from Last 3 Encounters:  08/19/23 250 lb 3.2 oz (113.5 kg)  02/18/23 250 lb 3.2 oz (113.5 kg)  08/19/22 250 lb 9.6 oz (113.7 kg)    Physical Exam   Lab Results  Component Value Date   TSH 4.740 (H) 08/13/2023   TSH 0.614 02/12/2023   TSH 2.140 08/10/2022   TSH 0.386 (L) 02/09/2022   TSH 1.760 07/31/2021   TSH 0.773 03/12/2021   TSH 1.160 01/20/2021   TSH 2.130 09/10/2020   TSH 7.19 (A) 08/14/2020   TSH 2.88 04/29/2020   FREET4 1.18 08/13/2023   FREET4 1.85 (H) 02/12/2023   FREET4 1.33 08/10/2022   FREET4 1.48 02/09/2022   FREET4 1.61 07/31/2021   FREET4 1.91 (H) 03/12/2021   FREET4 1.88 (H) 01/20/2021   FREET4 1.52 09/10/2020   FREET4 1.5 04/29/2020   FREET4 1.2 02/01/2020     Lipid Panel     Component Value Date/Time   CHOL 174 08/13/2023 0805   TRIG 105 08/13/2023 0805   HDL 46 08/13/2023 0805   CHOLHDL 3.8 08/13/2023 0805   LDLCALC 109 (H) 08/13/2023 0805   LABVLDL 19 08/13/2023 0805      Latest Ref Rng & Units 08/10/2022    1:51 PM  CMP  Glucose 70 - 99 mg/dL 846   BUN 8 - 27 mg/dL 16   Creatinine 9.62 - 1.00 mg/dL 9.52   Sodium 841 - 324 mmol/L 142   Potassium 3.5 - 5.2 mmol/L 4.6   Chloride 96 - 106 mmol/L 101   CO2 20 - 29 mmol/L 26   Calcium 8.7 - 10.3 mg/dL 9.5   Total Protein 6.0 - 8.5 g/dL 6.7   Total Bilirubin 0.0 - 1.2 mg/dL 0.3   Alkaline Phos 44 - 121 IU/L 78   AST 0 - 40 IU/L 21   ALT 0 - 32 IU/L 9  Assessment & Plan:   1.  Type 2 diabetes 2  postsurgical hypothyroidism 3. History of thyroid cancer 4.  Vitamin D deficiency 5.  Hyperlipidemia 6.  Obesity   -Regarding her controlled type 2 diabetes with point-of-care A1c of 6.5%. She did not tolerate Jardiance, metformin.  She declines an offer for GLP-1 receptor agonist.  This is despite her desire to achieve some weight loss.    At this time she will be kept on expectant management without medications.  She is given whole food plant based intervention to address her multiple chronic diseases including diabetes, hyperlipidemia, obesity.   - she acknowledges that there is a room for improvement in her food and drink choices. - Suggestion is made for her to avoid simple carbohydrates  from her diet including Cakes, Sweet Desserts, Ice Cream, Soda (diet and regular), Sweet Tea, Candies, Chips, Cookies, Store Bought Juices, Alcohol , Artificial Sweeteners,  Coffee Creamer, and "Sugar-free" Products, Lemonade. This will help patient to have more stable blood glucose profile and potentially avoid unintended weight gain.  The following Lifestyle Medicine recommendations according to American College of Lifestyle Medicine  [facility]) were discussed and and offered to patient and she  agrees to start the journey:  A. Whole Foods, Plant-Based Nutrition comprising of fruits and vegetables, plant-based proteins, whole-grain carbohydrates was discussed in detail with the patient.   A list for source of those nutrients were also provided to the patient.  Patient will use only water or unsweetened tea for hydration. B.  The need to stay away from risky substances including alcohol, smoking; obtaining 7 to 9 hours of restorative sleep, at least 150 minutes of moderate intensity exercise weekly, the importance of healthy social connections,  and stress management techniques were discussed. C.  A full color page of  Calorie density of various food groups per pound showing examples of each food groups was  provided to the patient.   -She has history of thyroid malignancy status post surgery in 2012.  Records of her initial treatment for thyroid cancer are not available to review.   -Her recent surveillance thyroid/neck ultrasound in January 2021 is negative for any residual thyroid tissue, mass, or other sonographic abnormality.    Regarding her postsurgical hypothyroidism: Her previsit thyroid function tests are consistent with slight under replacement.  I discussed and increase her Synthroid to 175 mcg p.o. daily before breakfast.     - We discussed about the correct intake of her thyroid hormone, on empty stomach at fasting, with water, separated by at least 30 minutes from breakfast and other medications,  and separated by more than 4 hours from calcium, iron, multivitamins, acid reflux medications (PPIs). -Patient is made aware of the fact that thyroid hormone replacement is needed for life, dose to be adjusted by periodic monitoring of thyroid function tests.  -She is advised to continue vitamin D3 5000 units daily.  She is advised to wear compression socks to help with her peripheral edema.  - I advised her  to maintain close follow up with Patient, No Pcp Per for primary care needs.   I spent  26  minutes in the care of the patient today including review of labs from Thyroid Function, CMP, and other relevant labs ; imaging/biopsy records (current and previous including abstractions from other facilities); face-to-face time discussing  her lab results and symptoms, medications doses, her options of short and long term treatment based on the latest standards of care / guidelines;  and documenting the encounter.  Bethany Riddle  participated in the discussions, expressed understanding, and voiced agreement with the above plans.  All questions were answered to her satisfaction. she is encouraged to contact clinic should she have any questions or concerns prior to her return visit.  Follow up  plan: Return in about 6 months (around 02/18/2024) for Fasting Labs  in AM B4 8.   Marquis Lunch, MD [facility] [address] [place], [place] [address] Phone: [phone]  Fax: [phone]     08/19/2023, 1:56 PM  This note was partially dictated with voice recognition software. Similar sounding words can be transcribed inadequately or may not  be corrected upon review.

## 2023-08-19 NOTE — Patient Instructions (Signed)
                                     Advice for Weight Management  -For most of us the best way to lose weight is by diet management. Generally speaking, diet management means consuming less calories intentionally which over time brings about progressive weight loss.  This can be achieved more effectively by avoiding ultra processed carbohydrates, processed meats, unhealthy fats.    It is critically important to know your numbers: how much calorie you are consuming and how much calorie you need. More importantly, our carbohydrates sources should be unprocessed naturally occurring  complex starch food items.  It is always important to balance nutrition also by  appropriate intake of proteins (mainly plant-based), healthy fats/oils, plenty of fruits and vegetables.   -The American College of Lifestyle Medicine (ACL M) recommends nutrition derived mostly from Whole Food, Plant Predominant Sources example an apple instead of applesauce or apple pie. Eat Plenty of vegetables, Mushrooms, fruits, Legumes, Whole Grains, Nuts, seeds in lieu of processed meats, processed snacks/pastries red meat, poultry, eggs.  Use only water or unsweetened tea for hydration.  The College also recommends the need to stay away from risky substances including alcohol, smoking; obtaining 7-9 hours of restorative sleep, at least 150 minutes of moderate intensity exercise weekly, importance of healthy social connections, and being mindful of stress and seek help when it is overwhelming.    -Sticking to a routine mealtime to eat 3 meals a day and avoiding unnecessary snacks is shown to have a big role in weight control. Under normal circumstances, the only time we burn stored energy is when we are hungry, so allow  some hunger to take place- hunger means no food between appropriate meal times, only water.  It is not advisable to starve.   -It is better to avoid simple carbohydrates including:  Cakes, Sweet Desserts, Ice Cream, Soda (diet and regular), Sweet Tea, Candies, Chips, Cookies, Store Bought Juices, Alcohol in Excess of  1-2 drinks a day, Lemonade,  Artificial Sweeteners, Doughnuts, Coffee Creamers, "Sugar-free" Products, etc, etc.  This is not a complete list.....    -Consulting with certified diabetes educators is proven to provide you with the most accurate and current information on diet.  Also, you may be  interested in discussing diet options/exchanges , we can schedule a visit with Bethany Riddle, RDN, CDE for individualized nutrition education.  -Exercise: If you are able: 30 -60 minutes a day ,4 days a week, or 150 minutes of moderate intensity exercise weekly.    The longer the better if tolerated.  Combine stretch, strength, and aerobic activities.  If you were told in the past that you have high risk for cardiovascular diseases, or if you are currently symptomatic, you may seek evaluation by your heart doctor prior to initiating moderate to intense exercise programs.                                  Additional Care Considerations for Diabetes/Prediabetes   -Diabetes  is a chronic disease.  The most important care consideration is regular follow-up with your diabetes care provider with the goal being avoiding or delaying its complications and to take advantage of advances in medications and technology.  If appropriate actions are taken early enough, type 2 diabetes can even be   reversed.  Seek information from the right source.  - Whole Food, Plant Predominant Nutrition is highly recommended: Eat Plenty of vegetables, Mushrooms, fruits, Legumes, Whole Grains, Nuts, seeds in lieu of processed meats, processed snacks/pastries red meat, poultry, eggs as recommended by American College of  Lifestyle Medicine (ACLM).  -Type 2 diabetes is known to coexist with other important comorbidities such as high blood pressure and high cholesterol.  It is critical to control not only the  diabetes but also the high blood pressure and high cholesterol to minimize and delay the risk of complications including coronary artery disease, stroke, amputations, blindness, etc.  The good news is that this diet recommendation for type 2 diabetes is also very helpful for managing high cholesterol and high blood blood pressure.  - Studies showed that people with diabetes will benefit from a class of medications known as ACE inhibitors and statins.  Unless there are specific reasons not to be on these medications, the standard of care is to consider getting one from these groups of medications at an optimal doses.  These medications are generally considered safe and proven to help protect the heart and the kidneys.    - People with diabetes are encouraged to initiate and maintain regular follow-up with eye doctors, foot doctors, dentists , and if necessary heart and kidney doctors.     - It is highly recommended that people with diabetes quit smoking or stay away from smoking, and get yearly  flu vaccine and pneumonia vaccine at least every 5 years.  See above for additional recommendations on exercise, sleep, stress management , and healthy social connections.      

## 2024-01-25 ENCOUNTER — Telehealth: Payer: Self-pay | Admitting: "Endocrinology

## 2024-01-25 ENCOUNTER — Other Ambulatory Visit: Payer: Self-pay | Admitting: *Deleted

## 2024-01-25 DIAGNOSIS — E1165 Type 2 diabetes mellitus with hyperglycemia: Secondary | ICD-10-CM

## 2024-01-25 DIAGNOSIS — E890 Postprocedural hypothyroidism: Secondary | ICD-10-CM

## 2024-01-25 DIAGNOSIS — E782 Mixed hyperlipidemia: Secondary | ICD-10-CM

## 2024-01-25 DIAGNOSIS — E559 Vitamin D deficiency, unspecified: Secondary | ICD-10-CM

## 2024-01-25 NOTE — Telephone Encounter (Signed)
Can you update labs and then pt wants them mailed

## 2024-01-25 NOTE — Telephone Encounter (Signed)
Labs are printed

## 2024-02-21 ENCOUNTER — Ambulatory Visit: Payer: Medicare Other | Admitting: "Endocrinology

## 2024-03-03 LAB — COMPREHENSIVE METABOLIC PANEL
ALT: 9 IU/L (ref 0–32)
AST: 17 IU/L (ref 0–40)
Albumin: 4.1 g/dL (ref 3.7–4.7)
Alkaline Phosphatase: 77 IU/L (ref 44–121)
BUN/Creatinine Ratio: 23 (ref 12–28)
BUN: 23 mg/dL (ref 8–27)
Bilirubin Total: 0.4 mg/dL (ref 0.0–1.2)
CO2: 24 mmol/L (ref 20–29)
Calcium: 9.3 mg/dL (ref 8.7–10.3)
Chloride: 103 mmol/L (ref 96–106)
Creatinine, Ser: 0.98 mg/dL (ref 0.57–1.00)
Globulin, Total: 2.7 g/dL (ref 1.5–4.5)
Glucose: 98 mg/dL (ref 70–99)
Potassium: 4.9 mmol/L (ref 3.5–5.2)
Sodium: 142 mmol/L (ref 134–144)
Total Protein: 6.8 g/dL (ref 6.0–8.5)
eGFR: 57 mL/min/{1.73_m2} — ABNORMAL LOW (ref >59)

## 2024-03-03 LAB — LIPID PANEL
Chol/HDL Ratio: 3.2 ratio (ref 0.0–4.4)
Cholesterol, Total: 177 mg/dL (ref 100–199)
HDL: 56 mg/dL (ref >39)
LDL Chol Calc (NIH): 106 mg/dL — ABNORMAL HIGH (ref 0–99)
Triglycerides: 78 mg/dL (ref 0–149)
VLDL Cholesterol Cal: 15 mg/dL (ref 5–40)

## 2024-03-03 LAB — T4, FREE: Free T4: 1.55 ng/dL (ref 0.82–1.77)

## 2024-03-03 LAB — TSH: TSH: 0.98 u[IU]/mL (ref 0.450–4.500)

## 2024-03-16 ENCOUNTER — Encounter: Payer: Self-pay | Admitting: "Endocrinology

## 2024-03-16 ENCOUNTER — Ambulatory Visit (INDEPENDENT_AMBULATORY_CARE_PROVIDER_SITE_OTHER): Payer: Medicare Other | Admitting: "Endocrinology

## 2024-03-16 VITALS — BP 122/66 | HR 52 | Ht 63.0 in | Wt 234.8 lb

## 2024-03-16 DIAGNOSIS — Z7985 Long-term (current) use of injectable non-insulin antidiabetic drugs: Secondary | ICD-10-CM

## 2024-03-16 DIAGNOSIS — Z85850 Personal history of malignant neoplasm of thyroid: Secondary | ICD-10-CM | POA: Diagnosis not present

## 2024-03-16 DIAGNOSIS — E890 Postprocedural hypothyroidism: Secondary | ICD-10-CM

## 2024-03-16 DIAGNOSIS — Z6841 Body Mass Index (BMI) 40.0 and over, adult: Secondary | ICD-10-CM

## 2024-03-16 DIAGNOSIS — E782 Mixed hyperlipidemia: Secondary | ICD-10-CM | POA: Diagnosis not present

## 2024-03-16 DIAGNOSIS — E1165 Type 2 diabetes mellitus with hyperglycemia: Secondary | ICD-10-CM

## 2024-03-16 LAB — POCT GLYCOSYLATED HEMOGLOBIN (HGB A1C): HbA1c, POC (controlled diabetic range): 6.9 % (ref 0.0–7.0)

## 2024-03-16 MED ORDER — TIRZEPATIDE 2.5 MG/0.5ML ~~LOC~~ SOAJ: 2.5000 mg | SUBCUTANEOUS | 0 refills | Status: DC

## 2024-03-16 NOTE — Progress Notes (Signed)
 03/16/2024, 12:41 PM  Endocrinology consult note   Subjective:    Patient ID: Bethany Riddle, female    DOB: 11-11-1941, PCP Patient, No Pcp Per   Past Medical History:  Diagnosis Date   Diabetes mellitus, type II (HCC)    Hypothyroidism    Skin cancer    Past Surgical History:  Procedure Laterality Date   ABDOMINAL HYSTERECTOMY     CATARACT EXTRACTION     FINGER NAIL SURGERY     HERNIA REPAIR     REPLACEMENT TOTAL KNEE     SKIN BIOPSY     THYROIDECTOMY     Social History   Socioeconomic History   Marital status: Married    Spouse name: Not on file   Number of children: Not on file   Years of education: Not on file   Highest education level: Not on file  Occupational History   Not on file  Tobacco Use   Smoking status: Never   Smokeless tobacco: Never  Vaping Use   Vaping status: Never Used  Substance and Sexual Activity   Alcohol use: Not Currently   Drug use: Never   Sexual activity: Not on file  Other Topics Concern   Not on file  Social History Narrative   Not on file   Social Drivers of Health   Financial Resource Strain: Not on file  Food Insecurity: Not on file  Transportation Needs: Not on file  Physical Activity: Not on file  Stress: Not on file  Social Connections: Not on file   Family History  Problem Relation Age of Onset   Diabetes Mother    Thyroid disease Mother    Cancer Mother    Cancer Sister    Diabetes Sister    Thyroid disease Sister    Outpatient Encounter Medications as of 03/16/2024  Medication Sig   nitroGLYCERIN (NITROSTAT) 0.4 MG SL tablet Place 0.4 mg under the tongue every 5 (five) minutes as needed.   SYNTHROID 150 MCG tablet Take 150 mcg by mouth daily.   tirzepatide [facility]) 2.5 MG/0.5ML Pen Inject 2.5 mg into the skin once a week.   TURMERIC CURCUMIN PO Take by mouth daily.   Ascorbic Acid (VITAMIN C) 1000 MG tablet Take 1,000 mg by mouth daily.   B  Complex-C (SUPER B COMPLEX PO) Take by mouth daily.   Cholecalciferol (VITAMIN D3) 125 MCG (5000 UT) CAPS Take 1 capsule (5,000 Units total) by mouth daily.   clopidogrel (PLAVIX) 75 MG tablet Take 1 tablet by mouth daily.   Cyanocobalamin (VITAMIN B-12 PO) Take 1 tablet by mouth daily in the afternoon.   diltiazem (CARDIZEM CD) 120 MG 24 hr capsule Take 1 capsule by mouth daily. (Patient not taking: Reported on 11/11/2021)   doxycycline (VIBRAMYCIN) 100 MG capsule daily. (Patient not taking: Reported on 03/16/2024)   furosemide (LASIX) 40 MG tablet Take 20-40 tablets by mouth daily as needed.   losartan (COZAAR) 25 MG tablet Take 25 mg by mouth daily.   potassium chloride SA (KLOR-CON) 20 MEQ tablet Take 1 tablet by mouth daily.   Probiotic, Lactobacillus, CAPS Take by mouth as needed.   rivaroxaban (XARELTO) 20 MG TABS tablet Take 0.5 tablets by mouth daily.   [  DISCONTINUED] SYNTHROID 175 MCG tablet Take 1 tablet (175 mcg total) by mouth daily before breakfast.   No facility-administered encounter medications on file as of 03/16/2024.   ALLERGIES: Allergies  Allergen Reactions   Jardiance [Empagliflozin]    Metformin And Related     VACCINATION STATUS:  There is no immunization history on file for this patient.  HPI Bethany Riddle is 83 y.o. female who follows in this clinic for postsurgical hypothyroidism, type 2 diabetes, hyperlipidemia..   She is currently not taking any medications for diabetes.   Her point-of-care A1c is 6.9% increasing from 6.5%.  She did not tolerate metformin and SGLT2 inhibitors before.   See previous visit notes.  She remains on Synthroid 150 mcg p.o. daily before breakfast.    She reports compliance with medication.  She continues to tolerate this medication, presents with no new complaints.  She has steady weight, lost  15 pounds since last visit.   she has significant dyslipidemia with LDL at 109.  She is not on statins. She denies palpitations,  tremor, heat intolerance.  She denies dysphagia, shortness of breath, no voice change.  Her thyroid/neck ultrasound from January 2022 is negative for any thyroid residual no recurrence.  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  The details of her treatment with surgery is not available for review.     She denies dysphagia, shortness of breath, nor voice change. -Her prior of  thyroid/neck ultrasound on July 27 2019 revealed evidence of prior thyroidectomy and otherwise negative ultrasound of the neck.  She denies any family history of thyroid malignancy.  In April 2022, she fell while trying to get out of her car and sustained fracture of one of her ribs.  Review of Systems  Limited as above.  Objective:    BP 122/66   Pulse (!) 52   Ht 5\' 3"  (1.6 m)   Wt 234 lb 12.8 oz (106.5 kg)   BMI 41.59 kg/m   Wt Readings from Last 3 Encounters:  03/16/24 234 lb 12.8 oz (106.5 kg)  08/19/23 250 lb 3.2 oz (113.5 kg)  02/18/23 250 lb 3.2 oz (113.5 kg)    Physical Exam   Lab Results  Component Value Date   TSH 0.980 03/02/2024   TSH 4.740 (H) 08/13/2023   TSH 0.614 02/12/2023   TSH 2.140 08/10/2022   TSH 0.386 (L) 02/09/2022   TSH 1.760 07/31/2021   TSH 0.773 03/12/2021   TSH 1.160 01/20/2021   TSH 2.130 09/10/2020   TSH 7.19 (A) 08/14/2020   FREET4 1.55 03/02/2024   FREET4 1.18 08/13/2023   FREET4 1.85 (H) 02/12/2023   FREET4 1.33 08/10/2022   FREET4 1.48 02/09/2022   FREET4 1.61 07/31/2021   FREET4 1.91 (H) 03/12/2021   FREET4 1.88 (H) 01/20/2021   FREET4 1.52 09/10/2020   FREET4 1.5 04/29/2020     Lipid Panel     Component Value Date/Time   CHOL 177 03/02/2024 0813   TRIG 78 03/02/2024 0813   HDL 56 03/02/2024 0813   CHOLHDL 3.2 03/02/2024 0813   LDLCALC 106 (H) 03/02/2024 0813   LABVLDL 15 03/02/2024 0813      Latest Ref Rng & Units 03/02/2024    8:13 AM 08/10/2022    1:51 PM  CMP  Glucose 70 - 99 mg/dL 98  161   BUN 8  - 27 mg/dL 23  16   Creatinine 0.96 - 1.00 mg/dL 0.45  4.09   Sodium  134 - 144 mmol/L 142  142   Potassium 3.5 - 5.2 mmol/L 4.9  4.6   Chloride 96 - 106 mmol/L 103  101   CO2 20 - 29 mmol/L 24  26   Calcium 8.7 - 10.3 mg/dL 9.3  9.5   Total Protein 6.0 - 8.5 g/dL 6.8  6.7   Total Bilirubin 0.0 - 1.2 mg/dL 0.4  0.3   Alkaline Phos 44 - 121 IU/L 77  78   AST 0 - 40 IU/L 17  21   ALT 0 - 32 IU/L 9  9      Assessment & Plan:   1.  Type 2 diabetes 2 . postsurgical hypothyroidism 3. History of thyroid cancer 4.  Vitamin D deficiency 5.  Hyperlipidemia 6.  Obesity   -Regarding her controlled type 2 diabetes with point-of-care A1c of 6.5%. She did not tolerate Jardiance, metformin.    She accepts an offer for GLP-1 receptor agonist prescription.  I discussed and prescribed Mounjaro 2.5 mg subcutaneously weekly.  This medication will be advanced as she tolerates.     She is given whole food plant based intervention to address her multiple chronic diseases including diabetes, hyperlipidemia, obesity.    - she acknowledges that there is a room for improvement in her food and drink choices. - Suggestion is made for her to avoid simple carbohydrates  from her diet including Cakes, Sweet Desserts, Ice Cream, Soda (diet and regular), Sweet Tea, Candies, Chips, Cookies, Store Bought Juices, Alcohol , Artificial Sweeteners,  Coffee Creamer, and "Sugar-free" Products, Lemonade. This will help patient to have more stable blood glucose profile and potentially avoid unintended weight gain.  The following Lifestyle Medicine recommendations according to American College of Lifestyle Medicine  [facility]) were discussed and and offered to patient and she  agrees to start the journey:  A. Whole Foods, Plant-Based Nutrition comprising of fruits and vegetables, plant-based proteins, whole-grain carbohydrates was discussed in detail with the patient.   A list for source of those nutrients were also provided to  the patient.  Patient will use only water or unsweetened tea for hydration. B.  The need to stay away from risky substances including alcohol, smoking; obtaining 7 to 9 hours of restorative sleep, at least 150 minutes of moderate intensity exercise weekly, the importance of healthy social connections,  and stress management techniques were discussed. C.  A full color page of  Calorie density of various food groups per pound showing examples of each food groups was provided to the patient.   -She has history of thyroid malignancy status post surgery in 2012.  Records of her initial treatment for thyroid cancer are not available to review.   -Her recent surveillance thyroid/neck ultrasound was negative for any residual thyroid tissue or other sonographic abnormalities.     Regarding her postsurgical hypothyroidism: Her previsit thyroid function tests are consistent with appropriate replacement.  She is advised to continue Synthroid 150 mcg p.o. daily before breakfast    - We discussed about the correct intake of her thyroid hormone, on empty stomach at fasting, with water, separated by at least 30 minutes from breakfast and other medications,  and separated by more than 4 hours from calcium, iron, multivitamins, acid reflux medications (PPIs). -Patient is made aware of the fact that thyroid hormone replacement is needed for life, dose to be adjusted by periodic monitoring of thyroid function tests.   -She is advised to continue vitamin D3 5000 units daily.  She  is advised to wear compression socks to help with her peripheral edema.  - I advised her  to maintain close follow up with Patient, No Pcp Per for primary care needs.   I spent  25  minutes in the care of the patient today including review of labs from CMP, Lipids, Thyroid Function, Hematology (current and previous including abstractions from other facilities); face-to-face time discussing  her blood glucose readings/logs, discussing  hypoglycemia and hyperglycemia episodes and symptoms, medications doses, her options of short and long term treatment based on the latest standards of care / guidelines;  discussion about incorporating lifestyle medicine;  and documenting the encounter. Risk reduction counseling performed per USPSTF guidelines to reduce obesity and cardiovascular risk factors.     Please refer to Patient Instructions for Blood Glucose Monitoring and Insulin/Medications Dosing Guide"  in media tab for additional information. Please  also refer to " Patient Self Inventory" in the Media  tab for reviewed elements of pertinent patient history.  Bethany Riddle participated in the discussions, expressed understanding, and voiced agreement with the above plans.  All questions were answered to her satisfaction. she is encouraged to contact clinic should she have any questions or concerns prior to her return visit.   Follow up plan: Return in about 3 months (around 06/16/2024) for Bring Meter/CGM Device/Logs- A1c in Office.   Marquis Lunch, MD [facility] [address] [place], [place] [address] Phone: [phone]  Fax: [phone]     03/16/2024, 12:41 PM  This note was partially dictated with voice recognition software. Similar sounding words can be transcribed inadequately or may not  be corrected upon review.

## 2024-03-16 NOTE — Patient Instructions (Signed)

## 2024-06-19 ENCOUNTER — Encounter: Payer: Self-pay | Admitting: "Endocrinology

## 2024-06-19 ENCOUNTER — Ambulatory Visit (INDEPENDENT_AMBULATORY_CARE_PROVIDER_SITE_OTHER): Admitting: "Endocrinology

## 2024-06-19 VITALS — BP 136/88 | HR 56 | Ht 63.0 in | Wt 232.4 lb

## 2024-06-19 DIAGNOSIS — Z85850 Personal history of malignant neoplasm of thyroid: Secondary | ICD-10-CM | POA: Diagnosis not present

## 2024-06-19 DIAGNOSIS — E890 Postprocedural hypothyroidism: Secondary | ICD-10-CM

## 2024-06-19 DIAGNOSIS — E119 Type 2 diabetes mellitus without complications: Secondary | ICD-10-CM | POA: Diagnosis not present

## 2024-06-19 DIAGNOSIS — Z7984 Long term (current) use of oral hypoglycemic drugs: Secondary | ICD-10-CM

## 2024-06-19 DIAGNOSIS — Z6841 Body Mass Index (BMI) 40.0 and over, adult: Secondary | ICD-10-CM

## 2024-06-19 DIAGNOSIS — E782 Mixed hyperlipidemia: Secondary | ICD-10-CM | POA: Diagnosis not present

## 2024-06-19 DIAGNOSIS — E1165 Type 2 diabetes mellitus with hyperglycemia: Secondary | ICD-10-CM

## 2024-06-19 LAB — POCT GLYCOSYLATED HEMOGLOBIN (HGB A1C): HbA1c, POC (controlled diabetic range): 6 % (ref 0.0–7.0)

## 2024-06-19 MED ORDER — RYBELSUS 3 MG PO TABS: 3.0000 mg | ORAL_TABLET | Freq: Every day | ORAL | Status: DC

## 2024-06-19 MED ORDER — RYBELSUS 7 MG PO TABS: 7.0000 mg | ORAL_TABLET | Freq: Every day | ORAL | 1 refills | Status: DC

## 2024-06-19 NOTE — Patient Instructions (Signed)

## 2024-06-19 NOTE — Progress Notes (Signed)
 06/19/2024, 12:58 PM  Endocrinology consult note   Subjective:    Patient ID: Bethany Riddle, female    DOB: 08/02/1941, PCP Patient, No Pcp Per   Past Medical History:  Diagnosis Date   Diabetes mellitus, type II (HCC)    Hypothyroidism    Skin cancer    Past Surgical History:  Procedure Laterality Date   ABDOMINAL HYSTERECTOMY     CATARACT EXTRACTION     FINGER NAIL SURGERY     HERNIA REPAIR     REPLACEMENT TOTAL KNEE     SKIN BIOPSY     THYROIDECTOMY     Social History   Socioeconomic History   Marital status: Married    Spouse name: Not on file   Number of children: Not on file   Years of education: Not on file   Highest education level: Not on file  Occupational History   Not on file  Tobacco Use   Smoking status: Never   Smokeless tobacco: Never  Vaping Use   Vaping status: Never Used  Substance and Sexual Activity   Alcohol use: Not Currently   Drug use: Never   Sexual activity: Not on file  Other Topics Concern   Not on file  Social History Narrative   Not on file   Social Drivers of Health   Financial Resource Strain: Not on file  Food Insecurity: Not on file  Transportation Needs: Not on file  Physical Activity: Not on file  Stress: Not on file  Social Connections: Not on file   Family History  Problem Relation Age of Onset   Diabetes Mother    Thyroid disease Mother    Cancer Mother    Cancer Sister    Diabetes Sister    Thyroid disease Sister    Outpatient Encounter Medications as of 06/19/2024  Medication Sig   Semaglutide (RYBELSUS) 3 MG TABS Take 1 tablet (3 mg total) by mouth daily.   Semaglutide (RYBELSUS) 7 MG TABS Take 1 tablet (7 mg total) by mouth daily.   Ascorbic Acid (VITAMIN C) 1000 MG tablet Take 1,000 mg by mouth daily.   B Complex-C (SUPER B COMPLEX PO) Take by mouth daily.   Cholecalciferol (VITAMIN D3) 125 MCG (5000 UT) CAPS Take 1 capsule (5,000 Units total)  by mouth daily.   clopidogrel (PLAVIX) 75 MG tablet Take 1 tablet by mouth daily.   Cyanocobalamin (VITAMIN B-12 PO) Take 1 tablet by mouth daily in the afternoon.   diltiazem (CARDIZEM CD) 120 MG 24 hr capsule Take 1 capsule by mouth daily. (Patient not taking: Reported on 11/11/2021)   doxycycline (VIBRAMYCIN) 100 MG capsule daily. (Patient not taking: Reported on 03/16/2024)   furosemide (LASIX) 40 MG tablet Take 20-40 tablets by mouth daily as needed.   losartan (COZAAR) 25 MG tablet Take 25 mg by mouth daily.   nitroGLYCERIN (NITROSTAT) 0.4 MG SL tablet Place 0.4 mg under the tongue every 5 (five) minutes as needed.   potassium chloride SA (KLOR-CON) 20 MEQ tablet Take 1 tablet by mouth daily.   Probiotic, Lactobacillus, CAPS Take by mouth as needed.   rivaroxaban (XARELTO) 20 MG TABS tablet Take 0.5 tablets by mouth daily.   SYNTHROID  150 MCG  tablet Take 150 mcg by mouth daily.   TURMERIC CURCUMIN PO Take by mouth daily.   [DISCONTINUED] tirzepatide  (MOUNJARO ) 2.5 MG/0.5ML Pen Inject 2.5 mg into the skin once a week.   No facility-administered encounter medications on file as of 06/19/2024.   ALLERGIES: Allergies  Allergen Reactions   Jardiance [Empagliflozin]    Metformin And Related     VACCINATION STATUS:  There is no immunization history on file for this patient.  HPI Bethany Riddle is 83 y.o. female who follows in this clinic for postsurgical hypothyroidism, type 2 diabetes, hyperlipidemia..  - She took GLP-1 receptor agonist weekly for majority of the interval time, however was told that her insurance will not cover Mounjaro .  Her point-of-care A1c is 6% improving from 6.9%.  She presents with blood glucose readings all on target.   She did not tolerate metformin and SGLT2 inhibitors on previous attempt.     She remains on Synthroid  150 mcg p.o. daily before breakfast.    She reports compliance with medication.  She continues to tolerate this medication, presents with no  new complaints.   She presents with a steady weight.  she has significant dyslipidemia with LDL at 106.  She is not on statins. She denies palpitations, tremor, heat intolerance.  She denies dysphagia, shortness of breath, no voice change.  Her thyroid/neck ultrasound from January 2022 is negative for any thyroid residual no recurrence.  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  The details of her treatment with surgery is not available for review.     She denies dysphagia, shortness of breath, nor voice change. -Her prior of  thyroid/neck ultrasound on July 27 2019 revealed evidence of prior thyroidectomy and otherwise negative ultrasound of the neck.  She denies any family history of thyroid malignancy.  In April 2022, she fell while trying to get out of her car and sustained fracture of one of her ribs.  Review of Systems  Limited as above.  Objective:    BP 136/88   Pulse (!) 56   Ht 5' 3 (1.6 m)   Wt 232 lb 6.4 oz (105.4 kg)   BMI 41.17 kg/m   Wt Readings from Last 3 Encounters:  06/19/24 232 lb 6.4 oz (105.4 kg)  03/16/24 234 lb 12.8 oz (106.5 kg)  08/19/23 250 lb 3.2 oz (113.5 kg)    Physical Exam   Lab Results  Component Value Date   TSH 0.980 03/02/2024   TSH 4.740 (H) 08/13/2023   TSH 0.614 02/12/2023   TSH 2.140 08/10/2022   TSH 0.386 (L) 02/09/2022   TSH 1.760 07/31/2021   TSH 0.773 03/12/2021   TSH 1.160 01/20/2021   TSH 2.130 09/10/2020   TSH 7.19 (A) 08/14/2020   FREET4 1.55 03/02/2024   FREET4 1.18 08/13/2023   FREET4 1.85 (H) 02/12/2023   FREET4 1.33 08/10/2022   FREET4 1.48 02/09/2022   FREET4 1.61 07/31/2021   FREET4 1.91 (H) 03/12/2021   FREET4 1.88 (H) 01/20/2021   FREET4 1.52 09/10/2020   FREET4 1.5 04/29/2020     Lipid Panel     Component Value Date/Time   CHOL 177 03/02/2024 0813   TRIG 78 03/02/2024 0813   HDL 56 03/02/2024 0813   CHOLHDL 3.2 03/02/2024 0813   LDLCALC 106 (H) 03/02/2024  0813   LABVLDL 15 03/02/2024 0813      Latest Ref Rng & Units 03/02/2024    8:13 AM 08/10/2022    1:51 PM  CMP  Glucose 70 - 99 mg/dL 98  887   BUN 8 - 27 mg/dL 23  16   Creatinine 9.42 - 1.00 mg/dL 9.01  9.08   Sodium 865 - 144 mmol/L 142  142   Potassium 3.5 - 5.2 mmol/L 4.9  4.6   Chloride 96 - 106 mmol/L 103  101   CO2 20 - 29 mmol/L 24  26   Calcium 8.7 - 10.3 mg/dL 9.3  9.5   Total Protein 6.0 - 8.5 g/dL 6.8  6.7   Total Bilirubin 0.0 - 1.2 mg/dL 0.4  0.3   Alkaline Phos 44 - 121 IU/L 77  78   AST 0 - 40 IU/L 17  21   ALT 0 - 32 IU/L 9  9      Assessment & Plan:   1.  Type 2 diabetes 2 . postsurgical hypothyroidism 3. History of thyroid cancer 4.  Vitamin D  deficiency 5.  Hyperlipidemia 6.  Obesity   -Regarding her controlled type 2 diabetes with point-of-care A1c of 6% improving from 6.5%.  She did not tolerate SGLT2 inhibitors nor metformin.   Evidently, she has benefited from GLP-1 receptor agonist.  She is she does not have coverage for injectable weekly GLP-1 receptor agonist, she will be given a prescription for Rybelsus.  Will start with 3 mg p.o. daily to advance to 7 mg p.o. daily before breakfast as she tolerates..  Side effects and precautions discussed with her.  She is given whole food plant based intervention to address her multiple chronic diseases including diabetes, hyperlipidemia, obesity.    - she acknowledges that there is a room for improvement in her food and drink choices. - Suggestion is made for her to avoid simple carbohydrates  from her diet including Cakes, Sweet Desserts, Ice Cream, Soda (diet and regular), Sweet Tea, Candies, Chips, Cookies, Store Bought Juices, Alcohol , Artificial Sweeteners,  Coffee Creamer, and Sugar-free Products, Lemonade. This will help patient to have more stable blood glucose profile and potentially avoid unintended weight gain.  The following Lifestyle Medicine recommendations according to American College of  Lifestyle Medicine  [facility]) were discussed and and offered to patient and she  agrees to start the journey:  A. Whole Foods, Plant-Based Nutrition comprising of fruits and vegetables, plant-based proteins, whole-grain carbohydrates was discussed in detail with the patient.   A list for source of those nutrients were also provided to the patient.  Patient will use only water or unsweetened tea for hydration. B.  The need to stay away from risky substances including alcohol, smoking; obtaining 7 to 9 hours of restorative sleep, at least 150 minutes of moderate intensity exercise weekly, the importance of healthy social connections,  and stress management techniques were discussed. C.  A full color page of  Calorie density of various food groups per pound showing examples of each food groups was provided to the patient.    -She has history of thyroid malignancy status post surgery in 2012.  Records of her initial treatment for thyroid cancer are not available to review.   -Her recent surveillance thyroid/neck ultrasound was negative for any residual thyroid tissue or other sonographic abnormalities.     She is on thyroid hormone replacement for postsurgical hypothyroidism.  Her recent labs were consistent with appropriate replacement.   She is advised to continue Synthroid  150 mcg p.o. daily before breakfast    - We discussed about the correct intake of her thyroid hormone, on empty stomach  at fasting, with water, separated by at least 30 minutes from breakfast and other medications,  and separated by more than 4 hours from calcium, iron, multivitamins, acid reflux medications (PPIs). -Patient is made aware of the fact that thyroid hormone replacement is needed for life, dose to be adjusted by periodic monitoring of thyroid function tests.   -She is advised to continue vitamin D3 5000 units daily.  She is advised to wear compression socks to help with her peripheral edema.  - I advised her  to  maintain close follow up with her PCP for primary care needs.    I spent  26  minutes in the care of the patient today including review of labs from CMP, Lipids, Thyroid Function, Hematology (current and previous including abstractions from other facilities); face-to-face time discussing  her blood glucose readings/logs, discussing hypoglycemia and hyperglycemia episodes and symptoms, medications doses, her options of short and long term treatment based on the latest standards of care / guidelines;  discussion about incorporating lifestyle medicine;  and documenting the encounter. Risk reduction counseling performed per USPSTF guidelines to reduce  obesity and cardiovascular risk factors.     Please refer to Patient Instructions for Blood Glucose Monitoring and Insulin/Medications Dosing Guide  in media tab for additional information. Please  also refer to  Patient Self Inventory in the Media  tab for reviewed elements of pertinent patient history.  Sunya Riddle participated in the discussions, expressed understanding, and voiced agreement with the above plans.  All questions were answered to her satisfaction. she is encouraged to contact clinic should she have any questions or concerns prior to her return visit.   Follow up plan: Return in about 6 months (around 12/19/2024) for Fasting Labs  in AM B4 8, A1c -[place].   Ranny Earl, MD [facility] [address] [place], [place] [address] Phone: [phone]  Fax: [phone]     06/19/2024, 12:58 PM  This note was partially dictated with voice recognition software. Similar sounding words can be transcribed inadequately or may not  be corrected upon review.

## 2024-06-20 ENCOUNTER — Other Ambulatory Visit (HOSPITAL_COMMUNITY): Payer: Self-pay

## 2024-06-20 ENCOUNTER — Telehealth: Payer: Self-pay | Admitting: Pharmacy Technician

## 2024-06-20 NOTE — Telephone Encounter (Signed)
 Pharmacy Patient Advocate Encounter   Received notification from CoverMyMeds that prior authorization for Rybelsus 7MG  tablets is required/requested.   Insurance verification completed.   The patient is insured through [employer] .   Per test claim: PA required; PA submitted to above mentioned insurance via CoverMyMeds Key/confirmation #/EOC [id number] Status is pending

## 2024-06-20 NOTE — Telephone Encounter (Signed)
 Pharmacy Patient Advocate Encounter  Received notification from [employer] that Prior Authorization for Rybelsus 7MG  tablets has been APPROVED from 03/22/24 to 06/20/25. Ran test claim, Copay is $143.54 for 1 month supply. This test claim was processed through [facility]- copay amounts may vary at other pharmacies due to pharmacy/plan contracts, or as the patient moves through the different stages of their insurance plan.   PA #/Case ID/Reference #: [id number]

## 2024-12-15 LAB — LIPID PANEL
Chol/HDL Ratio: 3.3 ratio (ref 0.0–4.4)
Cholesterol, Total: 176 mg/dL (ref 100–199)
HDL: 53 mg/dL
LDL Chol Calc (NIH): 104 mg/dL — ABNORMAL HIGH (ref 0–99)
Triglycerides: 104 mg/dL (ref 0–149)
VLDL Cholesterol Cal: 19 mg/dL (ref 5–40)

## 2024-12-15 LAB — FIB-4 W/REFLEX TO ELF
ALT: 7 IU/L (ref 0–32)
AST: 15 IU/L (ref 0–40)
FIB-4 Index: 2.07 (ref 0.00–2.67)
Platelets: 230 x10E3/uL (ref 150–450)

## 2024-12-15 LAB — TSH: TSH: 2.44 u[IU]/mL (ref 0.450–4.500)

## 2024-12-15 LAB — T4, FREE: Free T4: 1.4 ng/dL (ref 0.82–1.77)

## 2024-12-15 LAB — ENHANCED LIVER FIBROSIS (ELF): ELF(TM) Score: 10.58 — AB

## 2024-12-19 ENCOUNTER — Encounter: Payer: Self-pay | Admitting: "Endocrinology

## 2024-12-19 ENCOUNTER — Ambulatory Visit (INDEPENDENT_AMBULATORY_CARE_PROVIDER_SITE_OTHER): Admitting: "Endocrinology

## 2024-12-19 VITALS — BP 152/76 | HR 57 | Resp 18 | Ht 64.0 in | Wt 235.8 lb

## 2024-12-19 DIAGNOSIS — E890 Postprocedural hypothyroidism: Secondary | ICD-10-CM

## 2024-12-19 DIAGNOSIS — K760 Fatty (change of) liver, not elsewhere classified: Secondary | ICD-10-CM | POA: Insufficient documentation

## 2024-12-19 DIAGNOSIS — E1165 Type 2 diabetes mellitus with hyperglycemia: Secondary | ICD-10-CM

## 2024-12-19 DIAGNOSIS — Z7984 Long term (current) use of oral hypoglycemic drugs: Secondary | ICD-10-CM

## 2024-12-19 DIAGNOSIS — E782 Mixed hyperlipidemia: Secondary | ICD-10-CM

## 2024-12-19 DIAGNOSIS — E119 Type 2 diabetes mellitus without complications: Secondary | ICD-10-CM

## 2024-12-19 LAB — POCT GLYCOSYLATED HEMOGLOBIN (HGB A1C): Hemoglobin A1C: 6.5 % — AB (ref 4.0–5.6)

## 2024-12-19 MED ORDER — RYBELSUS 14 MG PO TABS: 14.0000 mg | ORAL_TABLET | Freq: Every day | ORAL | 1 refills | Status: AC

## 2024-12-19 NOTE — Patient Instructions (Signed)

## 2024-12-19 NOTE — Progress Notes (Signed)
 "                                                     12/19/2024, 11:30 AM  Endocrinology consult note   Subjective:    Patient ID: Bethany Riddle, female    DOB: July 23, 1941, PCP Patient, No Pcp Per   Past Medical History:  Diagnosis Date   Diabetes mellitus, type II (HCC)    Hypothyroidism    Skin cancer    Past Surgical History:  Procedure Laterality Date   ABDOMINAL HYSTERECTOMY     CATARACT EXTRACTION     FINGER NAIL SURGERY     HERNIA REPAIR     REPLACEMENT TOTAL KNEE     SKIN BIOPSY     THYROIDECTOMY     Social History   Socioeconomic History   Marital status: Married    Spouse name: Not on file   Number of children: Not on file   Years of education: Not on file   Highest education level: Not on file  Occupational History   Not on file  Tobacco Use   Smoking status: Never   Smokeless tobacco: Never  Vaping Use   Vaping status: Never Used  Substance and Sexual Activity   Alcohol use: Not Currently   Drug use: Never   Sexual activity: Not on file  Other Topics Concern   Not on file  Social History Narrative   Not on file   Social Drivers of Health   Tobacco Use: Low Risk (12/19/2024)   Patient History    Smoking Tobacco Use: Never    Smokeless Tobacco Use: Never    Passive Exposure: Not on file  Financial Resource Strain: Not on file  Food Insecurity: Not on file  Transportation Needs: Not on file  Physical Activity: Not on file  Stress: Not on file  Social Connections: Not on file  Depression (EYV7-0): Not on file  Alcohol Screen: Not on file  Housing: Not on file  Utilities: Not on file  Health Literacy: Not on file   Family History  Problem Relation Age of Onset   Diabetes Mother    Thyroid disease Mother    Cancer Mother    Cancer Sister    Diabetes Sister    Thyroid disease Sister    Outpatient Encounter Medications as of 12/19/2024  Medication Sig   ALPRAZolam (XANAX) 0.5 MG tablet Take 0.5 mg by mouth.   Ascorbic Acid  (VITAMIN C) 1000 MG tablet Take 1,000 mg by mouth daily.   B Complex-C (SUPER B COMPLEX PO) Take by mouth daily.   Cholecalciferol (VITAMIN D3) 125 MCG (5000 UT) CAPS Take 1 capsule (5,000 Units total) by mouth daily.   clopidogrel (PLAVIX) 75 MG tablet Take 1 tablet by mouth daily.   Cyanocobalamin (VITAMIN B-12 PO) Take 1 tablet by mouth daily in the afternoon.   furosemide (LASIX) 40 MG tablet Take 20-40 tablets by mouth daily as needed.   losartan (COZAAR) 25 MG tablet Take 25 mg by mouth daily.   nitroGLYCERIN (NITROSTAT) 0.4 MG SL tablet Place 0.4 mg under the tongue every 5 (five) minutes as needed.   potassium chloride SA (KLOR-CON) 20 MEQ tablet Take 1 tablet by mouth daily.   Probiotic, Lactobacillus, CAPS Take by mouth as needed.   rivaroxaban (XARELTO) 20 MG TABS tablet Take 0.5  tablets by mouth daily.   Semaglutide  (RYBELSUS ) 14 MG TABS Take 1 tablet (14 mg total) by mouth daily.   SYNTHROID  150 MCG tablet Take 150 mcg by mouth daily.   TURMERIC CURCUMIN PO Take by mouth daily.   [DISCONTINUED] Semaglutide  (RYBELSUS ) 7 MG TABS Take 1 tablet (7 mg total) by mouth daily.   diltiazem (CARDIZEM CD) 120 MG 24 hr capsule Take 1 capsule by mouth daily. (Patient not taking: Reported on 12/19/2024)   doxycycline (VIBRAMYCIN) 100 MG capsule daily. (Patient not taking: Reported on 12/19/2024)   [DISCONTINUED] Semaglutide  (RYBELSUS ) 3 MG TABS Take 1 tablet (3 mg total) by mouth daily. (Patient not taking: Reported on 12/19/2024)   No facility-administered encounter medications on file as of 12/19/2024.   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  Diabetes   Bethany Riddle is 83 y.o. female who follows in this clinic for postsurgical hypothyroidism, type 2 diabetes, hyperlipidemia..  - She has obtained coverage and staying on Rybelsus  7 mg p.o. daily for the majority of the interval time.  Her point-of-care A1c 6.5%.  She has no hypo or hyperglycemia.   She presents with blood glucose readings all on target.   She did not tolerate metformin and SGLT2 inhibitors on previous attempt.     She remains on Synthroid  150 mcg p.o. daily before breakfast.    She reports compliance with this medication.  She has no new complaints today.  She presents with fluctuating body weight.    she has significant dyslipidemia with LDL at 104.  She is not on statins. She denies palpitations, tremor, heat intolerance.  She denies dysphagia, shortness of breath, no voice change.  Her thyroid/neck ultrasound from January 2022 is negative for any thyroid residual no recurrence.  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  The details of her treatment with surgery is not available for review.     She denies dysphagia, shortness of breath, nor voice change. -Her prior of  thyroid/neck ultrasound on July 27 2019 revealed evidence of prior thyroidectomy and otherwise negative ultrasound of the neck.  She denies any family history of thyroid malignancy.  In April 2022, she fell while trying to get out of her car and sustained fracture of one of her ribs.  Review of Systems  Limited as above.  Objective:    BP (!) 152/76   Pulse (!) 57   Resp 18   Ht 5' 4 (1.626 m)   Wt 235 lb 12.8 oz (107 kg)   SpO2 96%   BMI 40.47 kg/m   Wt Readings from Last 3 Encounters:  12/19/24 235 lb 12.8 oz (107 kg)  06/19/24 232 lb 6.4 oz (105.4 kg)  03/16/24 234 lb 12.8 oz (106.5 kg)    Physical Exam   Lab  Results  Component Value Date   TSH 2.440 12/12/2024   TSH 0.980 03/02/2024   TSH 4.740 (H) 08/13/2023   TSH  0.614 02/12/2023   TSH 2.140 08/10/2022   TSH 0.386 (L) 02/09/2022   TSH 1.760 07/31/2021   TSH 0.773 03/12/2021   TSH 1.160 01/20/2021   TSH 2.130 09/10/2020   FREET4 1.40 12/12/2024   FREET4 1.55 03/02/2024   FREET4 1.18 08/13/2023   FREET4 1.85 (H) 02/12/2023   FREET4 1.33 08/10/2022   FREET4 1.48 02/09/2022   FREET4 1.61 07/31/2021   FREET4 1.91 (H) 03/12/2021   FREET4 1.88 (H) 01/20/2021   FREET4 1.52 09/10/2020     Lipid Panel     Component Value Date/Time   CHOL 176 12/12/2024 0812   TRIG 104 12/12/2024 0812   HDL 53 12/12/2024 0812   CHOLHDL 3.3 12/12/2024 0812   LDLCALC 104 (H) 12/12/2024 0812   LABVLDL 19 12/12/2024 0812      Latest Ref Rng & Units 12/12/2024    8:12 AM 03/02/2024    8:13 AM 08/10/2022    1:51 PM  CMP  Glucose 70 - 99 mg/dL  98  887   BUN 8 - 27 mg/dL  23  16   Creatinine 9.42 - 1.00 mg/dL  9.01  9.08   Sodium 865 - 144 mmol/L  142  142   Potassium 3.5 - 5.2 mmol/L  4.9  4.6   Chloride 96 - 106 mmol/L  103  101   CO2 20 - 29 mmol/L  24  26   Calcium 8.7 - 10.3 mg/dL  9.3  9.5   Total Protein 6.0 - 8.5 g/dL  6.8  6.7   Total Bilirubin 0.0 - 1.2 mg/dL  0.4  0.3   Alkaline Phos 44 - 121 IU/L  77  78   AST 0 - 40 IU/L 15  17  21    ALT 0 - 32 IU/L 7  9  9       Assessment & Plan:   1.  Type 2 diabetes 2 . postsurgical hypothyroidism 3. History of thyroid cancer 4.  Vitamin D  deficiency 5.  Hyperlipidemia 6.  Obesity   -Regarding her controlled type 2 diabetes with point-of-care A1c of 6.5% considered stable, will not need any further intervention.  She does not tolerate metformin nor SGLT2 inhibitors.  She has tolerated the oral GLP-1 receptor agonist.  I advised her to increase Rybelsus  to 14 mg p.o. daily.  Precautions discussed with her.   She is given whole food plant based intervention to address her multiple chronic  diseases including diabetes, hyperlipidemia, obesity.   - she acknowledges that there is a room for improvement in her food and drink choices. - Suggestion is made for her to avoid simple carbohydrates  from her diet including Cakes, Sweet Desserts, Ice Cream, Soda (diet and regular), Sweet Tea, Candies, Chips, Cookies, Store Bought Juices, Alcohol , Artificial Sweeteners,  Coffee Creamer, and Sugar-free Products, Lemonade. This will help patient to have more stable blood glucose profile and potentially avoid unintended weight gain.  The following Lifestyle Medicine recommendations according to American College of Lifestyle Medicine  [facility]) were discussed and and offered to patient and she  agrees to start the journey:  A. Whole Foods, Plant-Based Nutrition comprising of fruits and vegetables, plant-based proteins, whole-grain carbohydrates was discussed in detail with the patient.   A list for source of those nutrients were also provided to the patient.  Patient will use only water or unsweetened tea for hydration. B.  The need to stay away from risky substances including alcohol, smoking; obtaining 7 to 9 hours  of restorative sleep, at least 150 minutes of moderate intensity exercise weekly, the importance of healthy social connections,  and stress management techniques were discussed. C.  A full color page of  Calorie density of various food groups per pound showing examples of each food groups was provided to the patient.   -She has history of thyroid malignancy status post surgery in 2012.  Records of her initial treatment for thyroid cancer are not available to review.   -Her recent surveillance thyroid/neck ultrasound was negative for any residual thyroid tissue or other sonographic abnormalities.     She is on thyroid hormone replacement for postsurgical hypothyroidism.  Her recent labs were consistent with appropriate replacement.  She is advised to continue Synthroid  150 mcg p.o. daily  before breakfast.   - We discussed about the correct intake of her thyroid hormone, on empty stomach at fasting, with water, separated by at least 30 minutes from breakfast and other medications,  and separated by more than 4 hours from calcium, iron, multivitamins, acid reflux medications (PPIs). -Patient is made aware of the fact that thyroid hormone replacement is needed for life, dose to be adjusted by periodic monitoring of thyroid function tests.  -She is advised to continue vitamin D3 5000 units daily.  She is advised to wear compression socks to help with her peripheral edema.  - I advised her  to maintain close follow up with her PCP for primary care needs.    I spent  26  minutes in the care of the patient today including review of labs from Thyroid Function, CMP, and other relevant labs ; imaging/biopsy records (current and previous including abstractions from other facilities); face-to-face time discussing  her lab results and symptoms, medications doses, her options of short and long term treatment based on the latest standards of care / guidelines;   and documenting the encounter.  Yonna Palacios  participated in the discussions, expressed understanding, and voiced agreement with the above plans.  All questions were answered to her satisfaction. she is encouraged to contact clinic should she have any questions or concerns prior to her return visit.   Follow up plan: Return in about 6 months (around 06/19/2025) for F/U with Pre-visit Labs, A1c -[place].   Ranny Earl, MD [facility] [address] [place], [place] [address] Phone: [phone]  Fax: [phone]     12/19/2024, 11:30 AM  This note was partially dictated with voice recognition software. Similar sounding words can be transcribed inadequately or may not  be corrected upon review.  "

## 2024-12-26 ENCOUNTER — Encounter: Payer: Self-pay | Admitting: Gastroenterology

## 2025-01-02 ENCOUNTER — Telehealth: Payer: Self-pay | Admitting: *Deleted

## 2025-01-02 ENCOUNTER — Ambulatory Visit: Admitting: Gastroenterology

## 2025-01-02 ENCOUNTER — Encounter: Payer: Self-pay | Admitting: Gastroenterology

## 2025-01-02 VITALS — BP 125/62 | HR 59 | Temp 97.6°F | Ht 64.0 in | Wt 232.2 lb

## 2025-01-02 DIAGNOSIS — K5909 Other constipation: Secondary | ICD-10-CM

## 2025-01-02 DIAGNOSIS — K760 Fatty (change of) liver, not elsewhere classified: Secondary | ICD-10-CM

## 2025-01-02 DIAGNOSIS — K5900 Constipation, unspecified: Secondary | ICD-10-CM | POA: Insufficient documentation

## 2025-01-02 NOTE — Progress Notes (Signed)
 "    GI Office Note    Referring Provider: Lenis Ethelle ORN, MD Primary Care Physician:  Patient, No Pcp Per  Primary Gastroenterologist: Ozell Hollingshead, MD   Chief Complaint   Chief Complaint  Patient presents with   MASH     History of Present Illness   Bethany Riddle is a 84 y.o. female presenting today at the request of Dr. Lenis for further evaluation of MASLD.   She completed labs with Dr. Lenis back in December, fib 4 index of 2.07, elf of 10.58.  AST 15, ALT 7, platelets 230.  She has not had any recent liver imaging. MRI in 2009 with diffuse fatty infiltration of the liver.  Liver enlarged at that time with caudate lobe hypertrophy.  Unremarkable.  Possible findings of early cirrhosis.  She has never had an EGD or colonoscopy.  She believes she had a Cologuard in July 2025.   Discussed the use of AI scribe software for clinical note transcription with the patient, who gave verbal consent to proceed.  History of Present Illness Bethany Riddle is an 84 year old female with fatty liver disease, chronic constipation, and type 2 diabetes who presents for evaluation of abnormal liver screening labs.  She is anxious about recent abnormal liver labs. She was previously told she has fatty liver but has not had counseling or liver-directed therapy. CT abdomen with IV contrast in April 2022, seen through care everywhere, with unremarkable liver, spleen with several punctate calcifications.  As above MRI in 2009 with enlarged liver with caudate lobe hypertrophy, diffuse fatty infiltration, spleen unremarkable, could be seen in early cirrhosis.    In December 2025, an ELF score was elevated, raising concern for possible fibrosis.   She denies abdominal pain. She has severe chronic constipation with bowel movements about every two days that are hard and feel incomplete. Symptoms significantly affect her quality of life. Fluid intake is now restricted due to cardiac issues, though she  sometimes exceeds the restriction. She tries to increase dietary fiber and uses Metamucil daily, preferring gummies. She previously tried Linzess, felt she needed two doses, and stopped it. She has not tried Miralax.  She has post-thyroidectomy hypothyroidism on a stable thyroid hormone dose for the past three months and feels this contributes to weight and bowel issues.  She has type 2 diabetes, has not tolerated metformin, and has not used other chronic agents until starting oral semaglutide  (Rybelsus ) three months ago via her cardiologist for diabetes and weight loss. She is unsure if she can continue it due to cost.  She has cardiac disease managed with variable-dose furosemide (20-40 mg) based on daily weight, with associated fluid restriction.  She has prior deep vein thrombosis and pulmonary embolism, was treated with anticoagulation, and had a Watchman device placed. 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  Family history is notable for a daughter who required liver transplant in January 2025 for alcoholic cirrhosis, a father who died of cirrhosis with hepatitis and jaundice, and a sister with colon cancer requiring colostomy.    Wt Readings from Last 3 Encounters:  01/02/25 232 lb 3.2 oz (105.3 kg)  12/19/24 235 lb 12.8 oz (107 kg)  06/19/24 232 lb 6.4 oz (105.4 kg)      Prior Data   Results       Latest Ref Rng & Units 12/12/2024    8:12 AM  CBC  Platelets 150 - 450 x10E3/uL 230  Latest Ref Rng & Units 12/12/2024    8:12 AM 03/02/2024    8:13 AM 08/10/2022    1:51 PM  Hepatic Function  Total Protein 6.0 - 8.5 g/dL  6.8  6.7   Albumin 3.7 - 4.7 g/dL  4.1  4.2   AST 0 - 40 IU/L 15  17  21    ALT 0 - 32 IU/L 7  9  9    Alk Phosphatase 44 - 121 IU/L  77  78   Total Bilirubin 0.0 - 1.2 mg/dL  0.4  0.3       Latest Ref Rng & Units 03/02/2024    8:13 AM 08/10/2022    1:51 PM  BMP  Glucose 70 - 99 mg/dL 98  887   BUN 8 - 27 mg/dL 23  16    Creatinine 9.42 - 1.00 mg/dL 9.01  9.08   BUN/Creat Ratio 12 - 28 23  18    Sodium 134 - 144 mmol/L 142  142   Potassium 3.5 - 5.2 mmol/L 4.9  4.6   Chloride 96 - 106 mmol/L 103  101   CO2 20 - 29 mmol/L 24  26   Calcium 8.7 - 10.3 mg/dL 9.3  9.5    Lab Results  Component Value Date   HGBA1C 6.5 (A) 12/19/2024    11/2024: LDL 104, Tchol 176  Medications   Current Outpatient Medications  Medication Sig Dispense Refill   ALPRAZolam (XANAX) 0.5 MG tablet Take 0.5 mg by mouth.     Ascorbic Acid (VITAMIN C) 1000 MG tablet Take 1,000 mg by mouth daily.     Cholecalciferol (VITAMIN D3) 125 MCG (5000 UT) CAPS Take 1 capsule (5,000 Units total) by mouth daily. 90 capsule 1   Cyanocobalamin (VITAMIN B-12 PO) Take 1 tablet by mouth daily in the afternoon.     furosemide (LASIX) 40 MG tablet Take 20-40 tablets by mouth daily as needed.     losartan (COZAAR) 25 MG tablet Take 25 mg by mouth daily.     nitroGLYCERIN (NITROSTAT) 0.4 MG SL tablet Place 0.4 mg under the tongue every 5 (five) minutes as needed.     potassium chloride SA (KLOR-CON) 20 MEQ tablet Take 1 tablet by mouth daily. (Patient taking differently: Take 0.5 tablets by mouth daily.)     Probiotic, Lactobacillus, CAPS Take by mouth as needed.     rivaroxaban (XARELTO) 20 MG TABS tablet Take 0.5 tablets by mouth daily.     Semaglutide  (RYBELSUS ) 14 MG TABS Take 1 tablet (14 mg total) by mouth daily. 90 tablet 1   SYNTHROID  150 MCG tablet Take 150 mcg by mouth daily.     TURMERIC CURCUMIN PO Take by mouth daily.     No current facility-administered medications for this visit.    Allergies   Allergies as of 01/02/2025 - Review Complete 12/19/2024  Allergen Reaction Noted   Cefuroxime axetil Other (See Comments) 11/17/2017   Clopidogrel Other (See Comments) 10/30/2024   Isosorbide  04/06/2024   Jardiance [empagliflozin]  08/19/2022   Levofloxacin Other (See Comments) 10/30/2024   Metformin and related  08/19/2022   Other  Other (See Comments) 10/24/2018   Prednisone Other (See Comments) 08/27/2022    Past Medical History   Past Medical History:  Diagnosis Date   Diabetes mellitus, type II (HCC)    Diastolic CHF (HCC)    DVT (deep venous thrombosis) (HCC)    Fatty liver    History of pulmonary embolus (PE)  around time of covid, had PE/DVT. patient states her 24 and Me DNA showed she is prone for clotting   Hypothyroidism    Obesity    Presence of Watchman left atrial appendage closure device    Skin cancer     Past Surgical History   Past Surgical History:  Procedure Laterality Date   ABDOMINAL HYSTERECTOMY     CATARACT EXTRACTION     FINGER NAIL SURGERY     HERNIA REPAIR     REPLACEMENT TOTAL KNEE     SKIN BIOPSY     THYROIDECTOMY      Past Family History   Family History  Problem Relation Age of Onset   Diabetes Mother 65   Thyroid disease Mother    Cancer Mother    Cirrhosis Father 25   Cancer Sister    Diabetes Sister    Thyroid disease Sister    Colon cancer Sister    Kidney cancer Brother    Lung cancer Brother    Prostate cancer Son    Cirrhosis Daughter        liver transplant 32, etoh related possibly    Past Social History   Social History   Socioeconomic History   Marital status: Married    Spouse name: Not on file   Number of children: Not on file   Years of education: Not on file   Highest education level: Not on file  Occupational History   Not on file  Tobacco Use   Smoking status: Never   Smokeless tobacco: Never  Vaping Use   Vaping status: Never Used  Substance and Sexual Activity   Alcohol use: Not Currently   Drug use: Never   Sexual activity: Not Currently  Other Topics Concern   Not on file  Social History Narrative   Not on file   Social Drivers of Health   Tobacco Use: Low Risk (01/02/2025)   Patient History    Smoking Tobacco Use: Never    Smokeless Tobacco Use: Never    Passive Exposure: Not on file  Financial Resource  Strain: Not on file  Food Insecurity: Not on file  Transportation Needs: Not on file  Physical Activity: Not on file  Stress: Not on file  Social Connections: Not on file  Intimate Partner Violence: Not on file  Depression (EYV7-0): Not on file  Alcohol Screen: Not on file  Housing: Not on file  Utilities: Not on file  Health Literacy: Not on file    Review of Systems   General: Negative for anorexia, weight loss, fever, chills, fatigue, weakness. Eyes: Negative for vision changes.  ENT: Negative for hoarseness, difficulty swallowing , nasal congestion. CV: Negative for chest pain, angina, palpitations, dyspnea on exertion, peripheral edema.  Respiratory: Negative for dyspnea at rest, dyspnea on exertion, cough, sputum, wheezing.  GI: See history of present illness. GU:  Negative for dysuria, hematuria, urinary incontinence, urinary frequency, nocturnal urination.  MS: Negative for joint pain, low back pain.  Derm: Negative for rash or itching.  Neuro: Negative for weakness, abnormal sensation, seizure, frequent headaches, memory loss,  confusion.  Psych: Negative for anxiety, depression, suicidal ideation, hallucinations.  Endo: Negative for unusual weight change.  Heme: Negative for bruising or bleeding. Allergy: Negative for rash or hives.  Physical Exam   BP 125/62   Pulse (!) 59   Temp 97.6 F (36.4 C) (Temporal)   Ht 5' 4 (1.626 m)   Wt 232 lb 3.2 oz (105.3 kg)  LMP  (LMP Unknown)   BMI 39.86 kg/m    General: Well-nourished, well-developed in no acute distress.  Head: Normocephalic, atraumatic.   Eyes: Conjunctiva pink, no icterus. Mouth: Oropharyngeal mucosa moist and pink  Neck: Supple without thyromegaly, masses, or lymphadenopathy.  Lungs: Clear to auscultation bilaterally.  Heart: Regular rate and rhythm, no murmurs rubs or gallops.  Abdomen: Bowel sounds are normal, nontender, nondistended, no hepatosplenomegaly or masses,  no abdominal bruits, no  rebound or guarding.  Ventral hernia. Exam limited due to body habitus Rectal: not performed Extremities: No lower extremity edema. No clubbing or deformities.  Neuro: Alert and oriented x 4 , grossly normal neurologically.  Skin: Warm and dry, no rash or jaundice.   Psych: Alert and cooperative, normal mood and affect.  Labs   Lab Results  Component Value Date   TSH 2.440 12/12/2024   Lab Results  Component Value Date   ALT 7 12/12/2024   AST 15 12/12/2024   ALKPHOS 77 03/02/2024   BILITOT 0.4 03/02/2024   Lab Results  Component Value Date   NA 142 03/02/2024   CL 103 03/02/2024   K 4.9 03/02/2024   CO2 24 03/02/2024   BUN 23 03/02/2024   CREATININE 0.98 03/02/2024   EGFR 57 (L) 03/02/2024   CALCIUM 9.3 03/02/2024   ALBUMIN 4.1 03/02/2024   GLUCOSE 98 03/02/2024    Imaging Studies   No results found.  Assessment/Plan:    Assessment & Plan Fatty liver disease with possible significant fibrosis Review of imaging from 2009 (MRI) with hepatomegaly, caudate lobe atrophy, fatty infiltration at that time. CT with contrast in 2022, unremarkable liver. No splenomegaly on imaging to date. Risks factors include FH, obesity, HTN, DM, dyslipidemia.  -recent FIB 4 of 2.07 elevated with ELF of 10.58 -Ordered abd u/s with liver elastography   -treatment options may be limited in setting of thyroid disease -Discussed potential medication therapy initiation if significant fibrosis confirmed. -Instructions for fatty liver: Recommend 1-2# weight loss per week until ideal body weight through exercise & diet. Low fat/cholesterol diet.   Avoid sweets, sodas, fruit juices, sweetened beverages like tea, etc. Gradually increase exercise from 15 min daily up to 1 hr per day 5 days/week. No etoh use.   Chronic constipation Severe, persistent constipation likely due to hypothyroidism and limited response to prior treatments. Patient prefers Miralax trial. - Recommended trial of  polyethylene glycol (Miralax), starting with two scoops daily, then reducing to one scoop for maintenance. . - Advised continuation of fiber supplementation as tolerated. - Will monitor response to Miralax and adjust regimen as needed.       Sonny RAMAN. Ezzard, MHS, PA-C [facility]  "

## 2025-01-02 NOTE — Patient Instructions (Signed)
 We will obtain ultrasound of your liver in the near future.   Start miralax one capful mixed in 6 ounces of liquid twice daily until you have a soft stool, then continue once daily.  Instructions for fatty liver: Recommend 1-2# weight loss per week until ideal body weight through exercise & diet. Low fat/cholesterol diet.   Avoid sweets, sodas, fruit juices, sweetened beverages like tea, etc. Gradually increase exercise from 15 min daily up to 1 hr per day 5 days/week AS TOLERATED. No alcohol.

## 2025-01-02 NOTE — Telephone Encounter (Signed)
 Spoke with pt and she is aware of her us  appt details.

## 2025-01-09 ENCOUNTER — Encounter: Payer: Self-pay | Admitting: Gastroenterology

## 2025-01-10 ENCOUNTER — Ambulatory Visit (HOSPITAL_COMMUNITY)
Admission: RE | Admit: 2025-01-10 | Discharge: 2025-01-10 | Disposition: A | Discharge status: Home / Self Care | Source: Ambulatory Visit | Attending: Gastroenterology | Admitting: Gastroenterology

## 2025-01-10 DIAGNOSIS — K760 Fatty (change of) liver, not elsewhere classified: Secondary | ICD-10-CM | POA: Diagnosis present

## 2025-01-10 DIAGNOSIS — K5900 Constipation, unspecified: Secondary | ICD-10-CM | POA: Insufficient documentation

## 2025-01-15 ENCOUNTER — Ambulatory Visit: Payer: Self-pay | Admitting: Gastroenterology

## 2025-01-17 ENCOUNTER — Encounter: Payer: Self-pay | Admitting: Gastroenterology

## 2025-06-19 ENCOUNTER — Ambulatory Visit: Admitting: "Endocrinology
